# Patient Record
Sex: Male | Born: 1980 | Race: Black or African American | Hispanic: No | Marital: Single | Smoking: Current every day smoker
Health system: Southern US, Community
[De-identification: ages and names within clinical notes are randomized; demographics above are authoritative.]

## PROBLEM LIST (undated history)

## (undated) DIAGNOSIS — L309 Dermatitis, unspecified: Secondary | ICD-10-CM

## (undated) DIAGNOSIS — I10 Essential (primary) hypertension: Secondary | ICD-10-CM

---

## 2010-04-08 ENCOUNTER — Emergency Department (HOSPITAL_BASED_OUTPATIENT_CLINIC_OR_DEPARTMENT_OTHER)
Admission: EM | Admit: 2010-04-08 | Discharge: 2010-04-08 | Payer: Self-pay | Source: Home / Self Care | Admitting: Emergency Medicine

## 2012-04-13 ENCOUNTER — Emergency Department (HOSPITAL_BASED_OUTPATIENT_CLINIC_OR_DEPARTMENT_OTHER): Payer: Medicaid Other

## 2012-04-13 ENCOUNTER — Encounter (HOSPITAL_BASED_OUTPATIENT_CLINIC_OR_DEPARTMENT_OTHER): Payer: Self-pay | Admitting: *Deleted

## 2012-04-13 ENCOUNTER — Emergency Department (HOSPITAL_BASED_OUTPATIENT_CLINIC_OR_DEPARTMENT_OTHER)
Admission: EM | Admit: 2012-04-13 | Discharge: 2012-04-13 | Disposition: A | Discharge status: Home / Self Care | Payer: Medicaid Other | Attending: Emergency Medicine | Admitting: Emergency Medicine

## 2012-04-13 DIAGNOSIS — IMO0001 Reserved for inherently not codable concepts without codable children: Secondary | ICD-10-CM | POA: Insufficient documentation

## 2012-04-13 DIAGNOSIS — R0789 Other chest pain: Secondary | ICD-10-CM | POA: Insufficient documentation

## 2012-04-13 DIAGNOSIS — M5412 Radiculopathy, cervical region: Secondary | ICD-10-CM

## 2012-04-13 DIAGNOSIS — IMO0002 Reserved for concepts with insufficient information to code with codable children: Secondary | ICD-10-CM | POA: Insufficient documentation

## 2012-04-13 DIAGNOSIS — M542 Cervicalgia: Secondary | ICD-10-CM | POA: Insufficient documentation

## 2012-04-13 DIAGNOSIS — F17200 Nicotine dependence, unspecified, uncomplicated: Secondary | ICD-10-CM | POA: Insufficient documentation

## 2012-04-13 HISTORY — DX: Essential (primary) hypertension: I10

## 2012-04-13 NOTE — ED Provider Notes (Signed)
History     CSN: [id number]  Arrival date & time 04/13/12  1002   First MD Initiated Contact with Patient 04/13/12 1051      Chief Complaint  Patient presents with  . Arm Pain    (Consider location/radiation/quality/duration/timing/severity/associated sxs/prior treatment) HPI Patient complains of posterior neck pain radiating to both arms onset one week ago when he coughed forcibly while smoking a cigarette. Reports coughing for 2 months, nonproductive. Pain in right arm has resolved over the past few days. Left arm pain continues accompanied by a numb feeling in the left biceps area. He also reports left anterior chest pain with coughing for the past week no fever no shortness of breath no other complaint. No treatment prior to coming here. No other associated symptoms Past Medical History  Diagnosis Date  . Hypertension     History reviewed. No pertinent past surgical history.  No family history on file.  History  Substance Use Topics  . Smoking status: Current Every Day Smoker  . Smokeless tobacco: Not on file  . Alcohol Use: Yes   positive marijuana use    Review of Systems  Constitutional: Negative.   HENT: Positive for neck pain.   Respiratory: Positive for cough.   Cardiovascular: Positive for chest pain.  Gastrointestinal: Negative.   Musculoskeletal: Positive for myalgias.  Skin: Negative.   Neurological: Negative.   Hematological: Negative.   Psychiatric/Behavioral: Negative.   All other systems reviewed and are negative.    Allergies  Hydrocodone  Home Medications  No current outpatient prescriptions on file.  BP 162/89  Pulse 81  Temp 98 F (36.7 C) (Oral)  Resp 20  SpO2 100%  Physical Exam  Nursing note and vitals reviewed. Constitutional: He is oriented to person, place, and time. He appears well-developed and well-nourished.  HENT:  Head: Normocephalic and atraumatic.  Eyes: Conjunctivae normal are normal. Pupils are equal, round,  and reactive to light.  Neck: Neck supple. No tracheal deviation present. No thyromegaly present.       No bruit  Cardiovascular: Normal rate and regular rhythm.   No murmur heard. Pulmonary/Chest: Effort normal and breath sounds normal.  Abdominal: Soft. Bowel sounds are normal. He exhibits no distension. There is no tenderness.  Musculoskeletal: Normal range of motion. He exhibits no edema and no tenderness.       Entire spine nontender pain at left upper anterior arm on forcible flexion of the elbow  Lymphadenopathy:    He has no cervical adenopathy.  Neurological: He is alert and oriented to person, place, and time. He has normal reflexes. Coordination normal.       Motor strength 5 over 5 overall  Skin: Skin is warm and dry. No rash noted.  Psychiatric: He has a normal mood and affect.    ED Course  Procedures (including critical care time)  Labs Reviewed - No data to display No results found.  No results found for this or any previous visit. Dg Chest 2 View  04/13/2012  *RADIOLOGY REPORT*  Clinical Data: Cough for 2 months  CHEST - 2 VIEW  Comparison: Chest x-ray of 04/08/2010  Findings: No active infiltrate or effusion is seen.  Mild cardiomegaly is stable.  Mediastinal contours are stable.  No bony abnormality is seen.  IMPRESSION: Stable mild cardiomegaly.  No active lung disease.   Original Report Authenticated By: Dwyane Dee, M.D.     No diagnosis found. Chest x-ray reviewed by me Counseled patient on smoking cessation for 5  minutes  MDM  Arm pain and numbness in left arm likely secondary to cervical radiculopathy. Right arm symptoms have resolved within a few days  Plan Tylenol when necessary pain referral resource guide blood pressure recheck 3 weeks. Smoking cessation encouraged Diagnosis #1 cervical radiculopathy #2 chronic cough #3 hypertension #4 tobacco abuse      Doug Sou, MD 04/13/12 1218

## 2012-04-13 NOTE — ED Notes (Addendum)
Patient states that last weekend he was coughing and suddenly experiencing a sharp pain down his left arm that has continued to hurt. Feels burning and tingling in his neck that extends into the bicep of his left arm. Some tingling in his R arm, but not as bad. Concerned about constant coughing

## 2016-09-22 ENCOUNTER — Encounter (HOSPITAL_COMMUNITY): Payer: Self-pay | Admitting: Emergency Medicine

## 2016-09-22 ENCOUNTER — Emergency Department (HOSPITAL_COMMUNITY)
Admission: EM | Admit: 2016-09-22 | Discharge: 2016-09-22 | Disposition: A | Discharge status: Home / Self Care | Payer: Medicaid Other | Attending: Emergency Medicine | Admitting: Emergency Medicine

## 2016-09-22 DIAGNOSIS — F17200 Nicotine dependence, unspecified, uncomplicated: Secondary | ICD-10-CM | POA: Insufficient documentation

## 2016-09-22 DIAGNOSIS — L03113 Cellulitis of right upper limb: Secondary | ICD-10-CM

## 2016-09-22 DIAGNOSIS — L259 Unspecified contact dermatitis, unspecified cause: Secondary | ICD-10-CM | POA: Insufficient documentation

## 2016-09-22 DIAGNOSIS — L309 Dermatitis, unspecified: Secondary | ICD-10-CM

## 2016-09-22 DIAGNOSIS — I10 Essential (primary) hypertension: Secondary | ICD-10-CM | POA: Insufficient documentation

## 2016-09-22 HISTORY — DX: Dermatitis, unspecified: L30.9

## 2016-09-22 MED ORDER — CEPHALEXIN 500 MG PO CAPS: 500.0000 mg | ORAL_CAPSULE | Freq: Four times a day (QID) | ORAL | 0 refills | Status: AC

## 2016-09-22 MED ORDER — TRIAMCINOLONE ACETONIDE 0.1 % EX CREA: 1.0000 "application " | TOPICAL_CREAM | Freq: Two times a day (BID) | CUTANEOUS | 0 refills | Status: AC

## 2016-09-22 MED ORDER — SULFAMETHOXAZOLE-TRIMETHOPRIM 800-160 MG PO TABS: 1.0000 | ORAL_TABLET | Freq: Two times a day (BID) | ORAL | 0 refills | Status: AC

## 2016-09-22 NOTE — ED Notes (Signed)
Patient also wants to left knee to be checked due to having issues with fluid it on it for while.

## 2016-09-22 NOTE — Discharge Instructions (Signed)
Please read instructions below. Apply the triamcinolone cream to your arm 2 times per day. You can apply aquaphor or vaseline over this cream to help keep cream from rubbing off. Take the antibiotics as prescribed until they are gone.  Follow up with your primary care provider within 1 week. Return to the ER for fever, purulent drainage, or new or concerning symptoms.

## 2016-09-22 NOTE — ED Triage Notes (Signed)
Patient states since Tuesday been having swelling and pain in right elbow. Patient states that he has eczema and SO told him he has been scratching at arms a lot at night.  Patient state that feels like right arm is weaker due to swelling and pain.

## 2016-09-22 NOTE — ED Provider Notes (Signed)
[facility]-EMERGENCY DEPT Provider Note   CSN: [id number] Arrival date & time: 09/22/16  0806     History   Chief Complaint Chief Complaint  Patient presents with  . Joint Swelling    HPI Marvin Mcguire is a 36 y.o. male.  Patient with past medical history of eczema, hypertension, presents with a right arm pain and swelling since Tuesday evening. Patient states pain is worse with movement, not relieved with home remedies including shave better and charcoal soap. Patient reports charcoal soap is a been a new topical treatment times one week. He reports he has also been scratching his right arm in his sleep, per his significant other. States his arm is itchy, however more painful. Denies fever, chills, purulent drainage, history gout.       Past Medical History:  Diagnosis Date  . Eczema   . Hypertension     There are no active problems to display for this patient.   History reviewed. No pertinent surgical history.     Home Medications    Prior to Admission medications   Medication Sig Start Date End Date Taking? Authorizing Provider  acetaminophen (PAIN RELIEF) 500 MG tablet Take 500 mg by mouth every 6 (six) hours as needed (For pain.).   Yes [provider]  cephALEXin (KEFLEX) 500 MG capsule Take 1 capsule (500 mg total) by mouth 4 (four) times daily. 09/22/16 09/29/16  Russo, [place] N, PA-C  sulfamethoxazole-trimethoprim (BACTRIM DS,SEPTRA DS) 800-160 MG tablet Take 1 tablet by mouth 2 (two) times daily. 09/22/16 09/29/16  Russo, [place] N, PA-C  triamcinolone cream (KENALOG) 0.1 % Apply 1 application topically 2 (two) times daily. 09/22/16 09/29/16  Russo, [place] N, PA-C    Family History No family history on file.  Social History Social History  Substance Use Topics  . Smoking status: Current Every Day Smoker  . Smokeless tobacco: Never Used  . Alcohol use Yes     Allergies   Hydrocodone   Review of Systems Review of Systems  Constitutional: Negative for  chills and fever.  Musculoskeletal: Positive for joint swelling.  Skin: Positive for rash.     Physical Exam Updated Vital Signs BP (!) 163/115 (BP Location: Left Arm) Comment: [place], PA in room while BP was taken and deemed pt safe to be discharged.  Pulse 78   Temp 97.5 F (36.4 C) (Oral)   Resp 18   Ht 6\' 2"  (1.88 m)   Wt 122.5 kg (270 lb)   SpO2 100%   BMI 34.67 kg/m   Physical Exam  Constitutional: He appears well-developed and well-nourished. No distress.  HENT:  Head: Normocephalic and atraumatic.  Eyes: Conjunctivae are normal.  Cardiovascular: Normal rate and intact distal pulses.   Pulmonary/Chest: Effort normal.  Musculoskeletal:  Normal ROM of elbows, both internal and external rotation of the forearm, and elbow flexion and extension.  Skin:  Right arm with confluent eczematoid rash, skin is leathery, left arm w less severe rash. Area of erythema and significant edema surrounding R elbow. No obvious abscess. No purulent drainage.   Psychiatric: He has a normal mood and affect. His behavior is normal.  Nursing note and vitals reviewed.    ED Treatments / Results  Labs (all labs ordered are listed, but only abnormal results are displayed) Labs Reviewed - No data to display  EKG  EKG Interpretation None       Radiology No results found.  Procedures Procedures (including critical care time)  Medications Ordered in ED  Medications - No data to display   Initial Impression / Assessment and Plan / ED Course  I have reviewed the triage vital signs and the nursing notes.  Pertinent labs & imaging results that were available during my care of the patient were reviewed by me and considered in my medical decision making (see chart for details).     Pt presents with ezcema flare and surrounding cellulitis to right arm. Pt with normal ROM of elbow, afebrile, low suspicion for septic arthritis or gout. No hx of immunocompromise. Pt hypertensive in ED;  reports he does not take medication for his BP and uses home remedies; pt is asymptomatic.Will send with topical triamcinolone, instructed to use Aquaphor over steroid cream, drink plenty of fluids.  Pt given Rx for keflex and bactrim for cellulitis. Recommend f/u with PCP.  Pt safe for discharge home.  Patient discussed with and seen by Dr. Fredderick PhenixBelfi.  Discussed results, findings, treatment and follow up. Patient advised of strict return precautions. Patient verbalized understanding and agreed with plan.  Final Clinical Impressions(s) / ED Diagnoses   Final diagnoses:  Eczema of both upper extremities  Cellulitis of right arm    New Prescriptions Discharge Medication List as of 09/22/2016 11:08 AM    START taking these medications   Details  cephALEXin (KEFLEX) 500 MG capsule Take 1 capsule (500 mg total) by mouth 4 (four) times daily., Starting Fri 09/22/2016, Until Fri 09/29/2016, Print    sulfamethoxazole-trimethoprim (BACTRIM DS,SEPTRA DS) 800-160 MG tablet Take 1 tablet by mouth 2 (two) times daily., Starting Fri 09/22/2016, Until Fri 09/29/2016, Print    triamcinolone cream (KENALOG) 0.1 % Apply 1 application topically 2 (two) times daily., Starting Fri 09/22/2016, Until Fri 09/29/2016, Print         Russo, [place] N, PA-C 09/22/16 1441    Rolan BuccoBelfi, Melanie, MD 09/22/16 [phone]

## 2016-09-26 DIAGNOSIS — L309 Dermatitis, unspecified: Secondary | ICD-10-CM | POA: Insufficient documentation

## 2016-09-26 DIAGNOSIS — I10 Essential (primary) hypertension: Secondary | ICD-10-CM | POA: Insufficient documentation

## 2019-09-03 DIAGNOSIS — G4733 Obstructive sleep apnea (adult) (pediatric): Secondary | ICD-10-CM | POA: Insufficient documentation

## 2019-09-03 DIAGNOSIS — Z9989 Dependence on other enabling machines and devices: Secondary | ICD-10-CM | POA: Insufficient documentation

## 2020-07-05 ENCOUNTER — Emergency Department (HOSPITAL_COMMUNITY)
Admission: EM | Admit: 2020-07-05 | Discharge: 2020-07-05 | Disposition: A | Discharge status: Home / Self Care | Payer: Self-pay | Attending: Emergency Medicine | Admitting: Emergency Medicine

## 2020-07-05 ENCOUNTER — Other Ambulatory Visit: Payer: Self-pay

## 2020-07-05 ENCOUNTER — Encounter (HOSPITAL_COMMUNITY): Payer: Self-pay

## 2020-07-05 ENCOUNTER — Emergency Department (HOSPITAL_COMMUNITY): Payer: Self-pay

## 2020-07-05 DIAGNOSIS — M79672 Pain in left foot: Secondary | ICD-10-CM

## 2020-07-05 DIAGNOSIS — I10 Essential (primary) hypertension: Secondary | ICD-10-CM | POA: Insufficient documentation

## 2020-07-05 DIAGNOSIS — F17210 Nicotine dependence, cigarettes, uncomplicated: Secondary | ICD-10-CM | POA: Insufficient documentation

## 2020-07-05 DIAGNOSIS — S99912A Unspecified injury of left ankle, initial encounter: Secondary | ICD-10-CM | POA: Insufficient documentation

## 2020-07-05 DIAGNOSIS — X58XXXA Exposure to other specified factors, initial encounter: Secondary | ICD-10-CM | POA: Insufficient documentation

## 2020-07-05 MED ORDER — OXYCODONE-ACETAMINOPHEN 5-325 MG PO TABS
1.0000 | ORAL_TABLET | Freq: Once | ORAL | Status: AC
Start: 2020-07-05 — End: 2020-07-05
  Administered 2020-07-05: 1 via ORAL
  Filled 2020-07-05: qty 1

## 2020-07-05 NOTE — Discharge Instructions (Addendum)
Please follow-up with your sports medicine orthopedist regarding your joint pain.  Take Tylenol or Motrin for pain control.  Return if your pain is uncontrolled, you develop numbness, weakness, skin color changes.

## 2020-07-05 NOTE — ED Triage Notes (Signed)
Patient c/o left ankle fracture that occurred 2 months ago. Patient states 4 days ago he began having achilles pain and left inner ankle.

## 2020-07-05 NOTE — ED Provider Notes (Signed)
[facility]-EMERGENCY DEPT Provider Note   CSN: [id number] Arrival date & time: 07/05/20  1819     History Chief Complaint  Patient presents with  . Ankle Injury    Marvin Mcguire is a 39 y.o. male.  Presents to ER with concern for ankle/foot pain.  He had a fall and was diagnosed with an ankle fracture 2 months ago.  He states this occurred when he was in [place].  He was put in a boot and told to follow-up with orthopedics but he never did.  States that he stopped using the boot when his symptoms improved.  Had been symptom free for a couple weeks however over the past few days he has had worsening pain, pain is worse around his heel, and inner ankle.  Worse with movement.  He denies any numbness, weakness, skin color changes.  No new injuries.  Has not taken any medicine for this today.  HPI     Past Medical History:  Diagnosis Date  . Eczema   . Hypertension     There are no problems to display for this patient.   History reviewed. No pertinent surgical history.     No family history on file.  Social History   Tobacco Use  . Smoking status: Current Every Day Smoker    Packs/day: 1.00    Types: Cigarettes  . Smokeless tobacco: Never Used  Vaping Use  . Vaping Use: Former  . Substances: Nicotine, Flavoring  Substance Use Topics  . Alcohol use: Yes  . Drug use: No    Home Medications Prior to Admission medications   Medication Sig Start Date End Date Taking? Authorizing Provider  acetaminophen (PAIN RELIEF) 500 MG tablet Take 500 mg by mouth every 6 (six) hours as needed (For pain.).    [provider]    Allergies    Hydrocodone  Review of Systems   Review of Systems  Musculoskeletal: Positive for arthralgias.  All other systems reviewed and are negative.   Physical Exam Updated Vital Signs BP (!) 166/114 (BP Location: Left Arm)   Pulse (!) 107   Temp 97.9 F (36.6 C) (Oral)   Resp 16   Ht 6\' 2"  (1.88 m)    Wt 131.5 kg   SpO2 98%   BMI 37.23 kg/m   Physical Exam Vitals and nursing note reviewed.  Constitutional:      Appearance: He is well-developed.  HENT:     Head: Normocephalic and atraumatic.  Eyes:     Conjunctiva/sclera: Conjunctivae normal.  Cardiovascular:     Rate and Rhythm: Normal rate.     Pulses: Normal pulses.  Pulmonary:     Effort: Pulmonary effort is normal. No respiratory distress.  Abdominal:     Palpations: Abdomen is soft.     Tenderness: There is no abdominal tenderness.  Musculoskeletal:        General: No deformity or signs of injury.     Cervical back: Neck supple.     Comments: Left lower extremity: There is mild tenderness around the base of foot, medial malleolus, normal dorsiflexion plantarflexion, negative Thompson test; no tenderness throughout the thigh or calf, normal DP and PT pulses, sensation intact  Skin:    General: Skin is warm and dry.  Neurological:     Mental Status: He is alert.     ED Results / Procedures / Treatments   Labs (all labs ordered are listed, but only abnormal results are displayed) Labs  Reviewed - No data to display  EKG None  Radiology DG Foot Complete Left  Result Date: 07/05/2020 CLINICAL DATA:  History of left ankle fracture EXAM: LEFT FOOT - COMPLETE 3+ VIEW COMPARISON:  None. FINDINGS: No fracture or malalignment. Small plantar calcaneal spur. Mild degenerative changes at the first MTP joint and TMT joint. IMPRESSION: No acute osseous abnormality. Electronically Signed   By: Jasmine Pang M.D.   On: 07/05/2020 19:15    Procedures Procedures   Medications Ordered in ED Medications  oxyCODONE-acetaminophen (PERCOCET/ROXICET) 5-325 MG per tablet 1 tablet (1 tablet Oral Given 07/05/20 2217)    ED Course  I have reviewed the triage vital signs and the nursing notes.  Pertinent labs & imaging results that were available during my care of the patient were reviewed by me and considered in my medical decision  making (see chart for details).    MDM Rules/Calculators/A&P                         40 year old male presents to ER with concern for ankle and foot pain.  Reported trauma a couple months ago and prior dx of ankle fracture. His ankle appeared normal, some tenderness around heel and medial malleolus. Normal joint ROM, plantar/dorsiflexion intact, negative thompson test. Neurovascularly intact.  Suspect MSK strain. Plain film of foot was normal.  Recommend he follow-up with Ortho.  Discharged home.     After the discussed management above, the patient was determined to be safe for discharge.  The patient was in agreement with this plan and all questions regarding their care were answered.  ED return precautions were discussed and the patient will return to the ED with any significant worsening of condition.   Final Clinical Impression(s) / ED Diagnoses Final diagnoses:  Left foot pain    Rx / DC Orders ED Discharge Orders    None       Milagros Loll, MD 07/05/20 2223

## 2021-04-07 ENCOUNTER — Other Ambulatory Visit: Payer: Self-pay

## 2021-04-07 ENCOUNTER — Ambulatory Visit
Admission: EM | Admit: 2021-04-07 | Discharge: 2021-04-07 | Disposition: A | Discharge status: Home / Self Care | Payer: Self-pay | Attending: Emergency Medicine | Admitting: Emergency Medicine

## 2021-04-07 DIAGNOSIS — N451 Epididymitis: Secondary | ICD-10-CM | POA: Insufficient documentation

## 2021-04-07 DIAGNOSIS — M545 Low back pain, unspecified: Secondary | ICD-10-CM | POA: Insufficient documentation

## 2021-04-07 DIAGNOSIS — R1030 Lower abdominal pain, unspecified: Secondary | ICD-10-CM | POA: Insufficient documentation

## 2021-04-07 DIAGNOSIS — R1031 Right lower quadrant pain: Secondary | ICD-10-CM | POA: Insufficient documentation

## 2021-04-07 LAB — POCT URINALYSIS DIP (MANUAL ENTRY)
Bilirubin, UA: NEGATIVE
Blood, UA: NEGATIVE
Glucose, UA: NEGATIVE mg/dL
Ketones, POC UA: NEGATIVE mg/dL
Leukocytes, UA: NEGATIVE
Nitrite, UA: NEGATIVE
Protein Ur, POC: NEGATIVE mg/dL
Spec Grav, UA: <=1.005 — AB (ref 1.010–1.025)
Urobilinogen, UA: 0.2 E.U./dL
pH, UA: 5.5 (ref 5.0–8.0)

## 2021-04-07 MED ORDER — CEFTRIAXONE SODIUM 500 MG IJ SOLR
500.0000 mg | Freq: Once | INTRAMUSCULAR | Status: AC
  Administered 2021-04-07: 500 mg via INTRAMUSCULAR

## 2021-04-07 MED ORDER — LEVOFLOXACIN 500 MG PO TABS: 500.0000 mg | ORAL_TABLET | Freq: Every day | ORAL | 0 refills | Status: AC

## 2021-04-07 NOTE — ED Triage Notes (Addendum)
Pt reports having lower back pain on the right side (he states he believes it is a UTI). The patient states he has been having groin pain and frequent urination.  Started: 2 wks ago

## 2021-04-07 NOTE — Discharge Instructions (Addendum)
Current guidelines recommend ceftriaxone and levofloxacin for treatment of presumed epididymitis.  Based on your very helpful description of right groin pain up under your right testicle, this is most certainly epididymitis.  Ceftriaxone was provided by injection during her visit today.  Levofloxacin should be taken once daily for the next 10 days.  The urinalysis we performed the office today was not remarkable.  Urine culture will be performed and typically takes 3 to 5 days.  If there are any abnormal findings you will be contacted by phone, otherwise the result will be posted to your MyChart.    If you have not had complete relief of your symptoms in the next 7 to 10 days, please return for repeat evaluation.  I also strongly recommend you follow-up with your primary care provider for evaluation of benign prostatic hypertrophy, a common finding in men over the age of 70 that can cause increased frequency of urination.

## 2021-04-07 NOTE — ED Provider Notes (Signed)
[facility]    CSN: [id number] Arrival date & time: 04/07/21  1459    HISTORY  No chief complaint on file.  HPI Marvin Mcguire is a 40 y.o. male. Pt reports having right-sided lower back pain on the right side, localized pain in his right testicle that feels that it is up underneath his testicle, states his testicle itself does not hurt, states he has not noticed any changes in size of his testicle.  Patient also states he has noticed that he has increased frequency of urination.  Patient requests UTI testing.  Patient states his wife performed two home UTI tests, states one of the tests came back positive for bacteria, the other 1 did not.  Patient denies fever, aches, chills, genital lesions, penile discharge, burning with urination.  Patient denies known exposure to sexually transmitted disease, states he is in a monogamous relationship with his wife.  Patient states he does a lot of heavy lifting at his job.  Patient states he also took a 4-hour road trip about 3 weeks ago, states that was when his back started hurting.  Patient states that his testicle pain and increased frequency of urination began about 2 weeks ago.  The history is provided by the patient.  Past Medical History:  Diagnosis Date   Eczema    Hypertension    There are no problems to display for this patient.  History reviewed. No pertinent surgical history.  Home Medications    Prior to Admission medications   Medication Sig Start Date End Date Taking? Authorizing Provider  acetaminophen (PAIN RELIEF) 500 MG tablet Take 500 mg by mouth every 6 (six) hours as needed (For pain.).    [provider]   Family History History reviewed. No pertinent family history. Social History Social History   Tobacco Use   Smoking status: Every Day    Packs/day: 1.00    Types: Cigarettes   Smokeless tobacco: Never  Vaping Use   Vaping Use: Former   Substances: Nicotine, Flavoring  Substance Use  Topics   Alcohol use: Yes   Drug use: No   Allergies   Hydrocodone  Review of Systems Review of Systems Pertinent findings noted in history of present illness.   Physical Exam Triage Vital Signs ED Triage Vitals  Enc Vitals Group     BP 02/18/21 0827 (!) 147/82     Pulse Rate 02/18/21 0827 72     Resp 02/18/21 0827 18     Temp 02/18/21 0827 98.3 F (36.8 C)     Temp Source 02/18/21 0827 Oral     SpO2 02/18/21 0827 98 %     Weight --      Height --      Head Circumference --      Peak Flow --      Pain Score 02/18/21 0826 5     Pain Loc --      Pain Edu? --      Excl. in GC? --   No data found.  Updated Vital Signs BP (!) 189/133 (BP Location: Right Arm) Comment: pt states he does get headaches at times, provider aware   Pulse 68    Temp 98.7 F (37.1 C) (Oral)    SpO2 97%   Physical Exam Vitals and nursing note reviewed.  Constitutional:      General: He is not in acute distress.    Appearance: Normal appearance. He is not ill-appearing.  HENT:  Head: Normocephalic and atraumatic.  Eyes:     General: Lids are normal.        Right eye: No discharge.        Left eye: No discharge.     Extraocular Movements: Extraocular movements intact.     Conjunctiva/sclera: Conjunctivae normal.     Right eye: Right conjunctiva is not injected.     Left eye: Left conjunctiva is not injected.  Neck:     Trachea: Trachea and phonation normal.  Cardiovascular:     Rate and Rhythm: Normal rate and regular rhythm.     Pulses: Normal pulses.     Heart sounds: Normal heart sounds. No murmur heard.   No friction rub. No gallop.  Pulmonary:     Effort: Pulmonary effort is normal. No accessory muscle usage, prolonged expiration or respiratory distress.     Breath sounds: Normal breath sounds. No stridor, decreased air movement or transmitted upper airway sounds. No decreased breath sounds, wheezing, rhonchi or rales.  Chest:     Chest wall: No tenderness.  Genitourinary:     Comments: Pt politely declines GU exam, pt did provide a penile swab and a urine sample for testing.   Musculoskeletal:        General: Normal range of motion.     Cervical back: Normal range of motion and neck supple. Normal range of motion.  Lymphadenopathy:     Cervical: No cervical adenopathy.  Skin:    General: Skin is warm and dry.     Findings: No erythema or rash.  Neurological:     General: No focal deficit present.     Mental Status: He is alert and oriented to person, place, and time.  Psychiatric:        Mood and Affect: Mood normal.        Behavior: Behavior normal.    Visual Acuity Right Eye Distance:   Left Eye Distance:   Bilateral Distance:    Right Eye Near:   Left Eye Near:    Bilateral Near:     UC Couse / Diagnostics / Procedures:    EKG  Radiology No results found.  Procedures Procedures (including critical care time)  UC Diagnoses / Final Clinical Impressions(s)   I have reviewed the triage vital signs and the nursing notes.  Pertinent labs & imaging results that were available during my care of the patient were reviewed by me and considered in my medical decision making (see chart for details).   Final diagnoses:  Low back pain, unspecified back pain laterality, unspecified chronicity, unspecified whether sciatica present  Inguinal pain, unspecified laterality  Groin pain, right  Epididymitis, right   Urine dip today is unremarkable.  Given specific location of groin pain up under his right testicle, I am going to treat this patient empirically for presumed epididymitis.  Urine culture will be performed.  Antibiotics will be adjusted if needed based on urine culture findings.  Patient advised to follow-up with primary care for DRE to evaluate for possible BPH.  ED Prescriptions     Medication Sig Dispense Auth. Provider   levofloxacin (LEVAQUIN) 500 MG tablet Take 1 tablet (500 mg total) by mouth daily for 10 days. 10 tablet Theadora Rama  Scales, PA-C      PDMP not reviewed this encounter.  Pending results:  Labs Reviewed  POCT URINALYSIS DIP (MANUAL ENTRY) - Abnormal; Notable for the following components:      Result Value   Spec Grav, UA <=  1.005 (*)    All other components within normal limits  URINE CULTURE  CYTOLOGY, (ORAL, ANAL, URETHRAL) ANCILLARY ONLY    Medications Ordered in UC: Medications  cefTRIAXone (ROCEPHIN) injection 500 mg (has no administration in time range)    Disposition Upon Discharge:  Condition: stable for discharge home  Patient presents today with concerns for exposure to sexually transmitted disease, requesting testing.  STD screening was performed as indicated.  Patient has been advised that the results of screening will be made available to them via MyChart and, if there are any positive findings, they will be contacted by phone, recommendations for treatment will be advised and prescriptions will be provided as indicated based on clinical guidelines.  Patient has also been advised that if treatment is recommended, they should abstain from sexual intercourse of all forms until treatment is complete.  Patient has further been advised that once treatment is complete, they have not had a complete resolution of their symptoms, if any, they should continue to abstain from sexual intercourse with all forms and follow-up with her primary care provider or return to urgent care for repeat testing.  As such, the patient has been evaluated and assessed, work-up was performed and treatment was provided in alignment with urgent care protocols and evidence based medicine.  Patient/parent/caregiver has been advised that the patient may require follow up for further testing and/or treatment if the symptoms continue in spite of treatment, as clinically indicated and appropriate.  Routine symptom specific, illness specific and/or disease specific instructions were discussed with the patient and/or caregiver at  length.  Prevention strategies for avoiding STD exposure were also discussed.  The patient will follow up with their current PCP if and as advised. If the patient does not currently have a PCP we will assist them in obtaining one.   The patient may need specialty follow up if the symptoms continue, in spite of conservative treatment and management, for further workup, evaluation, consultation and treatment as clinically indicated and appropriate.  Patient/parent/caregiver verbalized understanding and agreement of plan as discussed.  All questions were addressed during visit.  Please see discharge instructions below for further details of plan.  Discharge Instructions:   Discharge Instructions      Current guidelines recommend ceftriaxone and levofloxacin for treatment of presumed epididymitis.  Based on your very helpful description of right groin pain up under your right testicle, this is most certainly epididymitis.  Ceftriaxone was provided by injection during her visit today.  Levofloxacin should be taken once daily for the next 10 days.  The urinalysis we performed the office today was not remarkable.  Urine culture will be performed and typically takes 3 to 5 days.  If there are any abnormal findings you will be contacted by phone, otherwise the result will be posted to your MyChart.    If you have not had complete relief of your symptoms in the next 7 to 10 days, please return for repeat evaluation.  I also strongly recommend you follow-up with your primary care provider for evaluation of benign prostatic hypertrophy, a common finding in men over the age of 21 that can cause increased frequency of urination.      This office note has been dictated using Teaching laboratory technician.  Unfortunately, and despite my best efforts, this method of dictation can sometimes lead to occasional typographical or grammatical errors.  I apologize in advance if this occurs.      Theadora Rama Scales, PA-C 04/07/21 1600

## 2021-04-10 LAB — URINE CULTURE: Culture: NO GROWTH

## 2021-04-11 LAB — CYTOLOGY, (ORAL, ANAL, URETHRAL) ANCILLARY ONLY
Chlamydia: NEGATIVE
Comment: NEGATIVE
Comment: NEGATIVE
Comment: NORMAL
Neisseria Gonorrhea: NEGATIVE
Trichomonas: NEGATIVE

## 2021-04-14 ENCOUNTER — Other Ambulatory Visit: Payer: Self-pay

## 2021-04-14 ENCOUNTER — Emergency Department (HOSPITAL_COMMUNITY): Payer: Self-pay

## 2021-04-14 ENCOUNTER — Emergency Department (HOSPITAL_COMMUNITY)
Admission: EM | Admit: 2021-04-14 | Discharge: 2021-04-14 | Disposition: A | Discharge status: Home / Self Care | Payer: Self-pay | Attending: Emergency Medicine | Admitting: Emergency Medicine

## 2021-04-14 ENCOUNTER — Encounter (HOSPITAL_COMMUNITY): Payer: Self-pay | Admitting: Emergency Medicine

## 2021-04-14 DIAGNOSIS — R109 Unspecified abdominal pain: Secondary | ICD-10-CM | POA: Insufficient documentation

## 2021-04-14 DIAGNOSIS — F17210 Nicotine dependence, cigarettes, uncomplicated: Secondary | ICD-10-CM | POA: Insufficient documentation

## 2021-04-14 DIAGNOSIS — M545 Low back pain, unspecified: Secondary | ICD-10-CM | POA: Insufficient documentation

## 2021-04-14 DIAGNOSIS — I10 Essential (primary) hypertension: Secondary | ICD-10-CM | POA: Insufficient documentation

## 2021-04-14 DIAGNOSIS — N50811 Right testicular pain: Secondary | ICD-10-CM | POA: Insufficient documentation

## 2021-04-14 LAB — CBC WITH DIFFERENTIAL/PLATELET
Abs Immature Granulocytes: 0.03 10*3/uL (ref 0.00–0.07)
Basophils Absolute: 0 10*3/uL (ref 0.0–0.1)
Basophils Relative: 0 %
Eosinophils Absolute: 0.3 10*3/uL (ref 0.0–0.5)
Eosinophils Relative: 3 %
HCT: 43 % (ref 39.0–52.0)
Hemoglobin: 14.3 g/dL (ref 13.0–17.0)
Immature Granulocytes: 0 %
Lymphocytes Relative: 32 %
Lymphs Abs: 3.5 10*3/uL (ref 0.7–4.0)
MCH: 31.3 pg (ref 26.0–34.0)
MCHC: 33.3 g/dL (ref 30.0–36.0)
MCV: 94.1 fL (ref 80.0–100.0)
Monocytes Absolute: 0.9 10*3/uL (ref 0.1–1.0)
Monocytes Relative: 8 %
Neutro Abs: 6.2 10*3/uL (ref 1.7–7.7)
Neutrophils Relative %: 57 %
Platelets: 330 10*3/uL (ref 150–400)
RBC: 4.57 MIL/uL (ref 4.22–5.81)
RDW: 15.4 % (ref 11.5–15.5)
WBC: 10.9 10*3/uL — ABNORMAL HIGH (ref 4.0–10.5)
nRBC: 0 % (ref 0.0–0.2)

## 2021-04-14 LAB — COMPREHENSIVE METABOLIC PANEL
ALT: 29 U/L (ref 0–44)
AST: 20 U/L (ref 15–41)
Albumin: 4.8 g/dL (ref 3.5–5.0)
Alkaline Phosphatase: 49 U/L (ref 38–126)
Anion gap: 6 (ref 5–15)
BUN: 15 mg/dL (ref 6–20)
CO2: 28 mmol/L (ref 22–32)
Calcium: 9.4 mg/dL (ref 8.9–10.3)
Chloride: 105 mmol/L (ref 98–111)
Creatinine, Ser: 0.98 mg/dL (ref 0.61–1.24)
GFR, Estimated: >60 mL/min (ref >60)
Glucose, Bld: 79 mg/dL (ref 70–99)
Potassium: 4.2 mmol/L (ref 3.5–5.1)
Sodium: 139 mmol/L (ref 135–145)
Total Bilirubin: 0.6 mg/dL (ref 0.3–1.2)
Total Protein: 8.2 g/dL — ABNORMAL HIGH (ref 6.5–8.1)

## 2021-04-14 LAB — URINALYSIS, ROUTINE W REFLEX MICROSCOPIC
Bilirubin Urine: NEGATIVE
Glucose, UA: NEGATIVE mg/dL
Hgb urine dipstick: NEGATIVE
Ketones, ur: NEGATIVE mg/dL
Leukocytes,Ua: NEGATIVE
Nitrite: NEGATIVE
Protein, ur: NEGATIVE mg/dL
Specific Gravity, Urine: 1.015 (ref 1.005–1.030)
pH: 6 (ref 5.0–8.0)

## 2021-04-14 LAB — LIPASE, BLOOD: Lipase: 29 U/L (ref 11–51)

## 2021-04-14 MED ORDER — AMLODIPINE BESYLATE 5 MG PO TABS: 5.0000 mg | ORAL_TABLET | Freq: Every day | ORAL | 1 refills | Status: DC

## 2021-04-14 MED ORDER — AMLODIPINE BESYLATE 5 MG PO TABS
5.0000 mg | ORAL_TABLET | Freq: Once | ORAL | Status: AC
Start: 2021-04-14 — End: 2021-04-14
  Administered 2021-04-14: 20:00:00 5 mg via ORAL
  Filled 2021-04-14: qty 1

## 2021-04-14 NOTE — ED Triage Notes (Signed)
Patient states he was seen at UC last week and diagnosed with epididymitis. States he has been taking his antibiotics but his back pain has continued and now having lower abdominal pain. Patient adds his fiance has been monitoring his BP and it has been elevated over the past few weeks. Pt adds he had been tested for sleep apnea in the past but was not able to afford the machine and is wanting to try and get one now.

## 2021-04-14 NOTE — Discharge Instructions (Addendum)
Make an appointment to follow-up with urology for further evaluation of the right testicular pain.  Start taking the amlodipine as directed daily for your high blood pressure.  Make an appointment to follow-up with the wellness clinic to have this followed up.  This medication may not be adequate to get your blood pressure under control.  Rest of today's work-up without any significant findings which is reassuring.  Return for any new or worse symptoms.

## 2021-04-14 NOTE — ED Provider Notes (Signed)
[facility] DEPT Provider Note   CSN: [id number] Arrival date & time: 04/14/21  1553     History Chief Complaint  Patient presents with   Abdominal Pain   Back Pain    Marvin Mcguire is a 40 y.o. male.  Patient presenting with a complaint right lower back pain.  Sore at the iliac crest area that radiates to his right testicle.  Patient seen December 15 in urgent care and treated as if epididymitis.  Patient on doxycycline.  Almost ready to finish the antibiotic.  But symptoms have not improved.  Patient is also been told in the past that he has hypertension.  Patient also told that he has sleep apnea.  Patient about 2 weeks ago quit drinking as well.  Patient denies any dysuria.  Denies any blood in his urine.  No nausea no vomiting.  Patient not tachycardic.      Past Medical History:  Diagnosis Date   Eczema    Hypertension     There are no problems to display for this patient.   History reviewed. No pertinent surgical history.     History reviewed. No pertinent family history.  Social History   Tobacco Use   Smoking status: Every Day    Packs/day: 1.00    Types: Cigarettes   Smokeless tobacco: Never  Vaping Use   Vaping Use: Former   Substances: Nicotine, Flavoring  Substance Use Topics   Alcohol use: Not Currently    Comment: quit 04/02/2021   Drug use: No    Home Medications Prior to Admission medications   Medication Sig Start Date End Date Taking? Authorizing Provider  acetaminophen (PAIN RELIEF) 500 MG tablet Take 500 mg by mouth every 6 (six) hours as needed (For pain.).    [provider]  levofloxacin (LEVAQUIN) 500 MG tablet Take 1 tablet (500 mg total) by mouth daily for 10 days. 04/07/21 04/17/21  Lynden Oxford Scales, PA-C    Allergies    Hydrocodone  Review of Systems   Review of Systems  Constitutional:  Negative for chills and fever.  HENT:  Negative for ear pain and sore throat.   Eyes:   Negative for pain and visual disturbance.  Respiratory:  Negative for cough and shortness of breath.   Cardiovascular:  Negative for chest pain and palpitations.  Gastrointestinal:  Negative for abdominal pain and vomiting.  Genitourinary:  Positive for testicular pain. Negative for dysuria, hematuria, penile discharge and scrotal swelling.  Musculoskeletal:  Positive for back pain. Negative for arthralgias.  Skin:  Negative for color change and rash.  Neurological:  Negative for seizures and syncope.  All other systems reviewed and are negative.  Physical Exam Updated Vital Signs BP (!) 193/133 (BP Location: Right Arm)    Pulse 65    Temp 98.5 F (36.9 C) (Oral)    Resp 14    Ht 1.88 m (6\' 2" )    Wt 124.7 kg    SpO2 100%    BMI 35.31 kg/m   Physical Exam Vitals and nursing note reviewed.  Constitutional:      General: He is not in acute distress.    Appearance: Normal appearance. He is well-developed.  HENT:     Head: Normocephalic and atraumatic.  Eyes:     Extraocular Movements: Extraocular movements intact.     Conjunctiva/sclera: Conjunctivae normal.     Pupils: Pupils are equal, round, and reactive to light.  Cardiovascular:     Rate and  Rhythm: Normal rate and regular rhythm.     Heart sounds: No murmur heard. Pulmonary:     Effort: Pulmonary effort is normal. No respiratory distress.     Breath sounds: Normal breath sounds.  Abdominal:     Palpations: Abdomen is soft.     Tenderness: There is no abdominal tenderness.  Genitourinary:    Penis: Normal.      Comments: Some discomfort to the rear of the right testicle Musculoskeletal:        General: No swelling.     Cervical back: Normal range of motion and neck supple.  Skin:    General: Skin is warm and dry.     Capillary Refill: Capillary refill takes less than 2 seconds.  Neurological:     General: No focal deficit present.     Mental Status: He is alert and oriented to person, place, and time.     Cranial  Nerves: No cranial nerve deficit.     Sensory: No sensory deficit.     Motor: No weakness.  Psychiatric:        Mood and Affect: Mood normal.    ED Results / Procedures / Treatments   Labs (all labs ordered are listed, but only abnormal results are displayed) Labs Reviewed  COMPREHENSIVE METABOLIC PANEL - Abnormal; Notable for the following components:      Result Value   Total Protein 8.2 (*)    All other components within normal limits  CBC WITH DIFFERENTIAL/PLATELET - Abnormal; Notable for the following components:   WBC 10.9 (*)    All other components within normal limits  LIPASE, BLOOD  URINALYSIS, ROUTINE W REFLEX MICROSCOPIC    EKG None  Radiology CT Renal Stone Study  Result Date: 04/14/2021 CLINICAL DATA:  Flank pain, kidney stone suspected. Diagnosed with epididymitis last week. On antibiotics with persistent low abdominal pain. Laterality not specified. EXAM: CT ABDOMEN AND PELVIS WITHOUT CONTRAST TECHNIQUE: Multidetector CT imaging of the abdomen and pelvis was performed following the standard protocol without IV contrast. COMPARISON:  No prior CT available.  Scrotal ultrasound same date. FINDINGS: Lower chest: No acute findings. There is a thin walled air cyst in the left lower lobe with mild mosaic attenuation of the lung bases. No significant pleural or pericardial effusion. Hepatobiliary: The liver appears unremarkable as imaged in the noncontrast state. No evidence of gallstones, gallbladder wall thickening or biliary dilatation. Pancreas: Unremarkable. No pancreatic ductal dilatation or surrounding inflammatory changes. Spleen: Normal in size without focal abnormality. Adrenals/Urinary Tract: Both adrenal glands appear normal. Both kidneys appear unremarkable. No evidence of urinary tract calculus, hydronephrosis or perinephric soft tissue stranding. The bladder appears normal. Stomach/Bowel: No enteric contrast administered. The stomach appears unremarkable for its  degree of distension. No evidence of bowel wall thickening, distention or surrounding inflammatory change. The appendix appears normal. Prominent stool throughout the colon with scattered diverticulosis. Vascular/Lymphatic: There are no enlarged abdominal or pelvic lymph nodes. No significant vascular findings on noncontrast imaging. Reproductive: The prostate gland and seminal vesicles appear unremarkable. Other: Tiny umbilical hernia containing only fat. Otherwise intact abdominal wall. No ascites or free intraperitoneal air. Musculoskeletal: No acute or significant osseous findings. IMPRESSION: 1. No acute findings or clear explanation for flank pain. No evidence of urinary tract calculus or hydronephrosis. 2. Prominent stool throughout the colon with scattered mild diverticulosis. 3. Mild mosaic attenuation at the lung bases which may relate to small airways disease. Electronically Signed   By: Caryl Comes.D.  On: 04/14/2021 19:16   US SCROTUM W/DOPPLER  Result Date: 04/14/2021 CLINICAL DATA:  Lower abdominal pain, epididymitis EXAM: SCROTAL ULTRASOUND DOPPLER ULTRASOUND OF THE TESTICLES TECHNIQUE: Complete ultrasound examination of the testicles, epididymis, and other scrotal structures was performed. Color and spectral Doppler ultrasound were also utilized to evaluate blood flow to the testicles. COMPARISON:  None. FINDINGS: Right testicle Measurements: 4.4 x 2.8 x 3.7 cm. No mass or microlithiasis visualized. Left testicle Measurements: 4.3 x 2.5 x 3.0 cm. No mass or microlithiasis visualized. Right epididymis:  Normal in size and appearance. Left epididymis:  Normal in size and appearance. Hydrocele:  None visualized. Varicocele:  None visualized. Pulsed Doppler interrogation of both testes demonstrates normal low resistance arterial and venous waveforms bilaterally. IMPRESSION: Negative scrotal ultrasound. No evidence of testicular torsion. Electronically Signed   By: Charline Bills M.D.    On: 04/14/2021 18:12    Procedures Procedures   Medications Ordered in ED Medications  amLODipine (NORVASC) tablet 5 mg (5 mg Oral Given 04/14/21 1942)    ED Course  I have reviewed the triage vital signs and the nursing notes.  Pertinent labs & imaging results that were available during my care of the patient were reviewed by me and considered in my medical decision making (see chart for details).    MDM Rules/Calculators/A&P                          Ultrasound done of scrotum with Doppler.  No acute findings.  Negative scrotal ultrasound no evidence of testicular torsion.  CT scan of the abdomen done because of questionable flank pain.  Was really low in the lumbar back on the right side.  CT scan without any acute findings other than a little bit of constipation.  Urinalysis here is negative.  Mild leukocytosis with a white count of 10.9.  Otherwise normal.  Lipase normal liver function tests normal electrolytes normal.  Patient's blood pressures though are quite high with diastolics in above 120.  And systolics in the 190 range.  But patient without any acute symptoms related to that.  No evidence of any significant endorgan dysfunction.  Patient's renal function is normal.  I feel patient needs follow-up with urology for the right testicular pain.  We do not have a good explanation for that.  No evidence of epididymitis here today.  But he will finish out his antibiotics.  Patient clearly needs treatment for the hypertension.  Started him on amlodipine here and will continue that at home we will have him follow-up with the wellness clinic for blood pressure checks.  Patient understands that this medication may not be sufficient to completely control his blood pressure.   Final Clinical Impression(s) / ED Diagnoses Final diagnoses:  Primary hypertension  Testicular pain, right  Flank pain    Rx / DC Orders ED Discharge Orders     None        Vanetta Mulders, MD 04/14/21  2019

## 2021-04-23 ENCOUNTER — Emergency Department (HOSPITAL_BASED_OUTPATIENT_CLINIC_OR_DEPARTMENT_OTHER)
Admission: EM | Admit: 2021-04-23 | Discharge: 2021-04-23 | Disposition: A | Discharge status: Home / Self Care | Payer: Self-pay | Attending: Emergency Medicine | Admitting: Emergency Medicine

## 2021-04-23 ENCOUNTER — Other Ambulatory Visit: Payer: Self-pay

## 2021-04-23 ENCOUNTER — Emergency Department (HOSPITAL_BASED_OUTPATIENT_CLINIC_OR_DEPARTMENT_OTHER): Payer: Self-pay

## 2021-04-23 ENCOUNTER — Encounter (HOSPITAL_BASED_OUTPATIENT_CLINIC_OR_DEPARTMENT_OTHER): Payer: Self-pay | Admitting: Emergency Medicine

## 2021-04-23 DIAGNOSIS — I10 Essential (primary) hypertension: Secondary | ICD-10-CM | POA: Insufficient documentation

## 2021-04-23 DIAGNOSIS — F17210 Nicotine dependence, cigarettes, uncomplicated: Secondary | ICD-10-CM | POA: Insufficient documentation

## 2021-04-23 DIAGNOSIS — R1010 Upper abdominal pain, unspecified: Secondary | ICD-10-CM

## 2021-04-23 DIAGNOSIS — R1012 Left upper quadrant pain: Secondary | ICD-10-CM | POA: Insufficient documentation

## 2021-04-23 DIAGNOSIS — R1013 Epigastric pain: Secondary | ICD-10-CM | POA: Insufficient documentation

## 2021-04-23 DIAGNOSIS — Z79899 Other long term (current) drug therapy: Secondary | ICD-10-CM | POA: Insufficient documentation

## 2021-04-23 LAB — COMPREHENSIVE METABOLIC PANEL
ALT: 27 U/L (ref 0–44)
AST: 22 U/L (ref 15–41)
Albumin: 4.3 g/dL (ref 3.5–5.0)
Alkaline Phosphatase: 52 U/L (ref 38–126)
Anion gap: 8 (ref 5–15)
BUN: 12 mg/dL (ref 6–20)
CO2: 25 mmol/L (ref 22–32)
Calcium: 9 mg/dL (ref 8.9–10.3)
Chloride: 105 mmol/L (ref 98–111)
Creatinine, Ser: 0.86 mg/dL (ref 0.61–1.24)
GFR, Estimated: >60 mL/min (ref >60)
Glucose, Bld: 109 mg/dL — ABNORMAL HIGH (ref 70–99)
Potassium: 4.2 mmol/L (ref 3.5–5.1)
Sodium: 138 mmol/L (ref 135–145)
Total Bilirubin: 0.5 mg/dL (ref 0.3–1.2)
Total Protein: 8.1 g/dL (ref 6.5–8.1)

## 2021-04-23 LAB — URINALYSIS, ROUTINE W REFLEX MICROSCOPIC
Bilirubin Urine: NEGATIVE
Glucose, UA: NEGATIVE mg/dL
Hgb urine dipstick: NEGATIVE
Ketones, ur: NEGATIVE mg/dL
Leukocytes,Ua: NEGATIVE
Nitrite: NEGATIVE
Protein, ur: NEGATIVE mg/dL
Specific Gravity, Urine: 1.02 (ref 1.005–1.030)
pH: 7 (ref 5.0–8.0)

## 2021-04-23 LAB — CBC
HCT: 42.5 % (ref 39.0–52.0)
Hemoglobin: 14.4 g/dL (ref 13.0–17.0)
MCH: 30.6 pg (ref 26.0–34.0)
MCHC: 33.9 g/dL (ref 30.0–36.0)
MCV: 90.4 fL (ref 80.0–100.0)
Platelets: 369 10*3/uL (ref 150–400)
RBC: 4.7 MIL/uL (ref 4.22–5.81)
RDW: 14.9 % (ref 11.5–15.5)
WBC: 10.4 10*3/uL (ref 4.0–10.5)
nRBC: 0 % (ref 0.0–0.2)

## 2021-04-23 LAB — LIPASE, BLOOD: Lipase: 34 U/L (ref 11–51)

## 2021-04-23 MED ORDER — ONDANSETRON 4 MG PO TBDP: 4.0000 mg | ORAL_TABLET | Freq: Once | ORAL | Status: DC | PRN

## 2021-04-23 MED ORDER — PANTOPRAZOLE SODIUM 20 MG PO TBEC: 20.0000 mg | DELAYED_RELEASE_TABLET | Freq: Every day | ORAL | 1 refills | Status: AC

## 2021-04-23 MED ORDER — HYDRALAZINE HCL 20 MG/ML IJ SOLN
10.0000 mg | Freq: Once | INTRAMUSCULAR | Status: AC
  Administered 2021-04-23: 10 mg via INTRAVENOUS
  Filled 2021-04-23: qty 1

## 2021-04-23 MED ORDER — AMLODIPINE BESYLATE 10 MG PO TABS: 10.0000 mg | ORAL_TABLET | Freq: Every day | ORAL | 1 refills | Status: DC

## 2021-04-23 MED ORDER — IOHEXOL 300 MG/ML  SOLN
100.0000 mL | Freq: Once | INTRAMUSCULAR | Status: AC | PRN
  Administered 2021-04-23: 100 mL via INTRAVENOUS

## 2021-04-23 MED ORDER — PANTOPRAZOLE SODIUM 40 MG IV SOLR
40.0000 mg | Freq: Once | INTRAVENOUS | Status: AC
  Administered 2021-04-23: 40 mg via INTRAVENOUS
  Filled 2021-04-23: qty 40

## 2021-04-23 NOTE — ED Notes (Signed)
D/c paperwork reviewed with pt, including prescriptions and f/u care. All questions addressed prior to d/c. Pt ambulatory to ED exit with s/o without assistance.

## 2021-04-23 NOTE — ED Triage Notes (Signed)
Pt arrives pov with c/o HTN and LUQ pain w nausea x 1 week. Pt endorses just starting b/p meds.

## 2021-04-23 NOTE — Discharge Instructions (Signed)
You were seen in the emergency department for elevated blood pressure and upper abdominal pain.  You had lab work done urinalysis and a CAT scan that did not show an obvious explanation for your symptoms.  Your blood pressure was elevated so we are increasing your amlodipine to 10 mg a day.  We are also starting you on some acid medication.  Please keep your appointment with primary care.  Return to the emergency department if any worsening or concerning symptoms

## 2021-04-23 NOTE — ED Notes (Signed)
Pt ambulatory to bathroom, no assistance needed.  

## 2021-04-23 NOTE — ED Provider Notes (Signed)
[facility] EMERGENCY DEPARTMENT Provider Note   CSN: [id number] Arrival date & time: 04/23/21  2585     History Chief Complaint  Patient presents with   Abdominal Pain    Marvin Mcguire is a 40 y.o. male.  He is here with a complaint of 3 weeks of abdominal pain and elevated blood pressure.  He said it is started after he discontinued drinking alcohol.  The initial pain was more in his scrotal area and lower abdomen.  Went to urgent care where they gave him antibiotics for possible epididymitis.  Was in the ED about 10 days ago and had a CAT scan and scrotal ultrasound along with labs.  He was started on amlodipine.  He still notes his blood pressure is high and he feels foggy in his head sometimes.  No chest pain or shortness of breath.  Scrotal symptoms have improved.  Still has upper abdominal pain now.  The history is provided by the patient.  Abdominal Pain Pain location:  Epigastric and LUQ Pain quality: aching   Pain radiates to:  Does not radiate Pain severity:  Moderate Onset quality:  Gradual Duration:  3 weeks Timing:  Intermittent Progression:  Unchanged Chronicity:  New Context: not recent travel and not trauma   Relieved by:  None tried Worsened by:  Nothing Ineffective treatments:  None tried Associated symptoms: no chest pain, no chills, no constipation, no cough, no diarrhea, no dysuria, no fever, no hematemesis, no hematochezia, no hematuria, no nausea, no shortness of breath, no sore throat and no vomiting       Past Medical History:  Diagnosis Date   Eczema    Hypertension     There are no problems to display for this patient.   History reviewed. No pertinent surgical history.     History reviewed. No pertinent family history.  Social History   Tobacco Use   Smoking status: Every Day    Packs/day: 1.00    Types: Cigarettes   Smokeless tobacco: Never  Vaping Use   Vaping Use: Former   Substances: Nicotine, Flavoring   Substance Use Topics   Alcohol use: Not Currently    Comment: quit 04/02/2021   Drug use: Yes    Types: Marijuana    Home Medications Prior to Admission medications   Medication Sig Start Date End Date Taking? Authorizing Provider  acetaminophen (PAIN RELIEF) 500 MG tablet Take 500 mg by mouth every 6 (six) hours as needed (For pain.).    [provider]  amLODipine (NORVASC) 5 MG tablet Take 1 tablet (5 mg total) by mouth daily. 04/14/21   Vanetta Mulders, MD    Allergies    Hydrocodone  Review of Systems   Review of Systems  Constitutional:  Negative for chills and fever.  HENT:  Negative for sore throat.   Eyes:  Negative for visual disturbance.  Respiratory:  Negative for cough and shortness of breath.   Cardiovascular:  Negative for chest pain.  Gastrointestinal:  Positive for abdominal pain. Negative for constipation, diarrhea, hematemesis, hematochezia, nausea and vomiting.  Genitourinary:  Negative for dysuria and hematuria.  Musculoskeletal:  Negative for back pain.  Skin:  Negative for rash.  Neurological:  Positive for headaches.   Physical Exam Updated Vital Signs BP (!) 168/118    Pulse 69    Temp 98.1 F (36.7 C) (Oral)    Resp 18    Ht 6\' 2"  (1.88 m)    Wt 129.3 kg  SpO2 100%    BMI 36.59 kg/m   Physical Exam Vitals and nursing note reviewed.  Constitutional:      General: He is not in acute distress.    Appearance: Normal appearance. He is well-developed.  HENT:     Head: Normocephalic and atraumatic.  Eyes:     Conjunctiva/sclera: Conjunctivae normal.  Cardiovascular:     Rate and Rhythm: Normal rate and regular rhythm.     Heart sounds: No murmur heard. Pulmonary:     Effort: Pulmonary effort is normal. No respiratory distress.     Breath sounds: Normal breath sounds.  Abdominal:     Palpations: Abdomen is soft.     Tenderness: There is no abdominal tenderness. There is no guarding or rebound.  Musculoskeletal:        General: No  swelling. Normal range of motion.     Cervical back: Neck supple.     Right lower leg: No edema.     Left lower leg: No edema.  Skin:    General: Skin is warm and dry.     Capillary Refill: Capillary refill takes less than 2 seconds.  Neurological:     General: No focal deficit present.     Mental Status: He is alert.  Psychiatric:        Mood and Affect: Mood normal.    ED Results / Procedures / Treatments   Labs (all labs ordered are listed, but only abnormal results are displayed) Labs Reviewed  COMPREHENSIVE METABOLIC PANEL - Abnormal; Notable for the following components:      Result Value   Glucose, Bld 109 (*)    All other components within normal limits  URINALYSIS, ROUTINE W REFLEX MICROSCOPIC - Abnormal; Notable for the following components:   Color, Urine STRAW (*)    All other components within normal limits  LIPASE, BLOOD  CBC    EKG EKG Interpretation  Date/Time:  Saturday April 23 2021 09:17:52 EST Ventricular Rate:  77 PR Interval:  152 QRS Duration: 80 QT Interval:  394 QTC Calculation: 445 R Axis:   -13 Text Interpretation: Normal sinus rhythm Possible Anterior infarct , age undetermined Abnormal ECG No previous ECGs available Confirmed by Aletta Edouard [phone]) on 04/23/2021 10:17:38 AM  Radiology CT Abdomen Pelvis W Contrast  Result Date: 04/23/2021 CLINICAL DATA:  Left upper quadrant abdominal pain and nausea for 1 week. EXAM: CT ABDOMEN AND PELVIS WITH CONTRAST TECHNIQUE: Multidetector CT imaging of the abdomen and pelvis was performed using the standard protocol following bolus administration of intravenous contrast. CONTRAST:  173mL OMNIPAQUE IOHEXOL 300 MG/ML  SOLN COMPARISON:  04/14/2021 FINDINGS: Lower chest: The lung bases are clear of acute process. No pleural effusion or pulmonary lesions. The heart is normal in size. No pericardial effusion. The distal esophagus and aorta are unremarkable. Hepatobiliary: No hepatic lesions or  intrahepatic biliary dilatation. The gallbladder is normal. No common bile duct dilatation. Pancreas: No mass, inflammation or ductal dilatation. Spleen: Normal size.  No focal lesions. Adrenals/Urinary Tract: Adrenal glands and kidneys are normal. The bladder is unremarkable. Stomach/Bowel: The stomach, duodenum, small bowel and colon are unremarkable. No acute inflammatory changes, mass lesions or obstructive findings. The terminal ileum is normal. The appendix is normal. Vascular/Lymphatic: The aorta is normal in caliber. No dissection. The branch vessels are patent. The major venous structures are patent. No mesenteric or retroperitoneal mass or adenopathy. Small scattered lymph nodes are noted. Reproductive: The prostate gland and seminal vesicles are unremarkable. Other: No pelvic  mass or adenopathy. No free pelvic fluid collections. No inguinal mass or adenopathy. No abdominal wall hernia or subcutaneous lesions. Musculoskeletal: No significant bony findings. IMPRESSION: No acute abdominal/pelvic findings, mass lesions or adenopathy. Electronically Signed   By: Marijo Sanes M.D.   On: 04/23/2021 14:01    Procedures Procedures   Medications Ordered in ED Medications  ondansetron (ZOFRAN-ODT) disintegrating tablet 4 mg (has no administration in time range)  pantoprazole (PROTONIX) injection 40 mg (has no administration in time range)  hydrALAZINE (APRESOLINE) injection 10 mg (has no administration in time range)    ED Course  I have reviewed the triage vital signs and the nursing notes.  Pertinent labs & imaging results that were available during my care of the patient were reviewed by me and considered in my medical decision making (see chart for details).  Clinical Course as of 04/23/21 1733  Sat Apr 23, 2021  1422 On review of patient's prior blood pressures he seems to be consistently elevated.  We will increase his amlodipine that he was restarted on.  Also started on a PPI. [MB]  [id number]  Reviewed results of work-up with him.  He is comfortable plan for discharge.  He has a PCP appointment in about 10 days.  Return instructions discussed [MB]    Clinical Course User Index [MB] Hayden Rasmussen, MD   MDM Rules/Calculators/A&P                         This patient complains of elevated blood pressures, upper abdominal pain; this involves an extensive number of treatment Options and is a complaint that carries with it a high risk of complications and Morbidity. The differential includes hypertension, hypertensive emergency, dissection, peptic ulcer disease, gastritis,, splenic infarct  I ordered, reviewed and interpreted labs, which included CBC with normal white count normal hemoglobin, chemistries normal, lipase normal, urinalysis unremarked I ordered medication IV Protonix, IV hydralazine for elevated blood pressure I ordered imaging studies which included CT abdomen pelvis with contrast and I independently    visualized and interpreted imaging which showed no acute findings Additional history obtained from patient's wife Previous records obtained and reviewed in epic including prior ED visit 10 days ago  After the interventions stated above, I reevaluated the patient and found patient to be somewhat improved.  Blood pressure remains elevated.  On review of prior vital signs patient's blood pressures have been consistently with diastolics above [id number].  He is otherwise not in distress.  We will increase his amlodipine and add a PPI.  He has a follow-up appointment with primary care next week.  Return instructions discussed     Final Clinical Impression(s) / ED Diagnoses Final diagnoses:  Upper abdominal pain  Primary hypertension    Rx / DC Orders ED Discharge Orders     None        Hayden Rasmussen, MD 04/23/21 1735

## 2021-05-04 NOTE — Progress Notes (Incomplete)
New Patient Office Visit  Subjective:  Patient ID: ED MANDICH, male    DOB: Dec 02, 1980  Age: 41 y.o. MRN: [id number]  CC: No chief complaint on file.   HPI JERMARCUS MCFADYEN presents for HTN Abd pain, epidydimitis  Pcv hiv hcv flu  Past Medical History:  Diagnosis Date   Eczema    Hypertension     No past surgical history on file.  No family history on file.  Social History   Socioeconomic History   Marital status: Single    Spouse name: Not on file   Number of children: Not on file   Years of education: Not on file   Highest education level: Not on file  Occupational History   Not on file  Tobacco Use   Smoking status: Every Day    Packs/day: 1.00    Types: Cigarettes   Smokeless tobacco: Never  Vaping Use   Vaping Use: Former   Substances: Nicotine, Flavoring  Substance and Sexual Activity   Alcohol use: Not Currently    Comment: quit 04/02/2021   Drug use: Yes    Types: Marijuana   Sexual activity: Not on file  Other Topics Concern   Not on file  Social History Narrative   Not on file   Social Determinants of Health   Financial Resource Strain: Not on file  Food Insecurity: Not on file  Transportation Needs: Not on file  Physical Activity: Not on file  Stress: Not on file  Social Connections: Not on file  Intimate Partner Violence: Not on file    ROS Review of Systems  Objective:   Today's Vitals: There were no vitals taken for this visit.  Physical Exam Radiology CT Abdomen Pelvis W Contrast   Result Date: 04/23/2021 CLINICAL DATA:  Left upper quadrant abdominal pain and nausea for 1 week. EXAM: CT ABDOMEN AND PELVIS WITH CONTRAST TECHNIQUE: Multidetector CT imaging of the abdomen and pelvis was performed using the standard protocol following bolus administration of intravenous contrast. CONTRAST:  OMNIPAQUE IOHEXOL 300 MG/ML  SOLN COMPARISON:  04/14/2021 FINDINGS: Lower chest: The lung bases are clear of acute  process. No pleural effusion or pulmonary lesions. The heart is normal in size. No pericardial effusion. The distal esophagus and aorta are unremarkable. Hepatobiliary: No hepatic lesions or intrahepatic biliary dilatation. The gallbladder is normal. No common bile duct dilatation. Pancreas: No mass, inflammation or ductal dilatation. Spleen: Normal size.  No focal lesions. Adrenals/Urinary Tract: Adrenal glands and kidneys are normal. The bladder is unremarkable. Stomach/Bowel: The stomach, duodenum, small bowel and colon are unremarkable. No acute inflammatory changes, mass lesions or obstructive findings. The terminal ileum is normal. The appendix is normal. Vascular/Lymphatic: The aorta is normal in caliber. No dissection. The branch vessels are patent. The major venous structures are patent. No mesenteric or retroperitoneal mass or adenopathy. Small scattered lymph nodes are noted. Reproductive: The prostate gland and seminal vesicles are unremarkable. Other: No pelvic mass or adenopathy. No free pelvic fluid collections. No inguinal mass or adenopathy. No abdominal wall hernia or subcutaneous lesions. Musculoskeletal: No significant bony findings. IMPRESSION: No acute abdominal/pelvic findings, mass lesions or adenopathy. Electronically Signed   By: Rudie Meyer M.D.   On: 04/23/2021 14:01   Assessment & Plan:   Problem List Items Addressed This Visit   None   Outpatient Encounter Medications as of 05/05/2021  Medication Sig   hydrochlorothiazide (HYDRODIURIL) 25 MG tablet Take by mouth.   acetaminophen (PAIN RELIEF) 500 MG  tablet Take 500 mg by mouth every 6 (six) hours as needed (For pain.).   amLODipine (NORVASC) 10 MG tablet Take 1 tablet (10 mg total) by mouth daily.   Multiple Vitamins-Minerals (MULTIVITAMIN MEN) TABS Take 1 tablet by mouth daily.   pantoprazole (PROTONIX) 20 MG tablet Take 1 tablet (20 mg total) by mouth daily.   triamcinolone cream (KENALOG) 0.1 % Apply topically.    No facility-administered encounter medications on file as of 05/05/2021.    Follow-up: No follow-ups on file.   Shan Levans, MD

## 2021-05-05 ENCOUNTER — Inpatient Hospital Stay: Payer: Self-pay | Admitting: Critical Care Medicine

## 2021-06-17 ENCOUNTER — Inpatient Hospital Stay: Payer: Self-pay | Admitting: Nurse Practitioner

## 2022-10-27 ENCOUNTER — Other Ambulatory Visit: Payer: Self-pay

## 2022-10-27 ENCOUNTER — Emergency Department (HOSPITAL_BASED_OUTPATIENT_CLINIC_OR_DEPARTMENT_OTHER)
Admission: EM | Admit: 2022-10-27 | Discharge: 2022-10-27 | Payer: Self-pay | Attending: Emergency Medicine | Admitting: Emergency Medicine

## 2022-10-27 ENCOUNTER — Emergency Department (HOSPITAL_BASED_OUTPATIENT_CLINIC_OR_DEPARTMENT_OTHER): Payer: Self-pay

## 2022-10-27 ENCOUNTER — Encounter (HOSPITAL_BASED_OUTPATIENT_CLINIC_OR_DEPARTMENT_OTHER): Payer: Self-pay

## 2022-10-27 DIAGNOSIS — Z79899 Other long term (current) drug therapy: Secondary | ICD-10-CM | POA: Insufficient documentation

## 2022-10-27 DIAGNOSIS — F17200 Nicotine dependence, unspecified, uncomplicated: Secondary | ICD-10-CM | POA: Insufficient documentation

## 2022-10-27 DIAGNOSIS — R0789 Other chest pain: Secondary | ICD-10-CM

## 2022-10-27 DIAGNOSIS — I10 Essential (primary) hypertension: Secondary | ICD-10-CM | POA: Insufficient documentation

## 2022-10-27 LAB — CBC
HCT: 38.3 % — ABNORMAL LOW (ref 39.0–52.0)
Hemoglobin: 13.2 g/dL (ref 13.0–17.0)
MCH: 32.1 pg (ref 26.0–34.0)
MCHC: 34.5 g/dL (ref 30.0–36.0)
MCV: 93.2 fL (ref 80.0–100.0)
Platelets: 316 10*3/uL (ref 150–400)
RBC: 4.11 MIL/uL — ABNORMAL LOW (ref 4.22–5.81)
RDW: 15.2 % (ref 11.5–15.5)
WBC: 11.5 10*3/uL — ABNORMAL HIGH (ref 4.0–10.5)
nRBC: 0 % (ref 0.0–0.2)

## 2022-10-27 LAB — TROPONIN I (HIGH SENSITIVITY)
Troponin I (High Sensitivity): 9 ng/L (ref <18)
Troponin I (High Sensitivity): 10 ng/L (ref <18)

## 2022-10-27 LAB — BASIC METABOLIC PANEL
Anion gap: 9 (ref 5–15)
BUN: 17 mg/dL (ref 6–20)
CO2: 23 mmol/L (ref 22–32)
Calcium: 8.8 mg/dL — ABNORMAL LOW (ref 8.9–10.3)
Chloride: 103 mmol/L (ref 98–111)
Creatinine, Ser: 1.13 mg/dL (ref 0.61–1.24)
GFR, Estimated: >60 mL/min (ref >60)
Glucose, Bld: 106 mg/dL — ABNORMAL HIGH (ref 70–99)
Potassium: 3.9 mmol/L (ref 3.5–5.1)
Sodium: 135 mmol/L (ref 135–145)

## 2022-10-27 LAB — D-DIMER, QUANTITATIVE: D-Dimer, Quant: 0.4 ug/mL-FEU (ref 0.00–0.50)

## 2022-10-27 MED ORDER — AMLODIPINE BESYLATE 5 MG PO TABS
10.0000 mg | ORAL_TABLET | Freq: Once | ORAL | Status: AC
  Administered 2022-10-27: 10 mg via ORAL
  Filled 2022-10-27: qty 2

## 2022-10-27 MED ORDER — AMLODIPINE BESYLATE 10 MG PO TABS: 10.0000 mg | ORAL_TABLET | Freq: Every day | ORAL | 1 refills | Status: DC

## 2022-10-27 NOTE — Discharge Instructions (Addendum)
Take the Norvasc also known as amlodipine as directed 10 mg every day.  Make an appoint follow-up with cardiology and appointment with the wellness clinic.  Workup for the chest pain here today without any acute findings but since you are over age 42 follow-up with cardiology is important.  Return for any new or worse symptoms.  Today's evaluation showed no acute cardiac findings.  And no evidence of any blood clots in the lungs.  Also would recommend starting a baby aspirin a day.

## 2022-10-27 NOTE — ED Notes (Signed)
Lab notified to add on D-Dimer. 

## 2022-10-27 NOTE — ED Notes (Signed)
Entered pt room to assess and pt was sitting on chair and had removed vs monitoring. Pt prefers to leave it off despite education and having HTN. NAD NARD.

## 2022-10-27 NOTE — ED Provider Notes (Signed)
[place] EMERGENCY DEPARTMENT AT [facility] Provider Note   CSN: [id number] Arrival date & time: 10/27/22  1714     History  Chief Complaint  Patient presents with   Chest Pain    Marvin Mcguire is a 42 y.o. male.  Patient with a history of hyper but has been noncompliant on his meds.  But has occasionally been taken Norvasc amlodipine.  Past medical history significant for just hypertension and eczema.  Is an everyday smoker.  Patient states that the chest pain is associate with shortness of breath no leg swelling.  Chest pain has been constant but waxes and wanes.  It is worse with certain positions.       Home Medications Prior to Admission medications   Medication Sig Start Date End Date Taking? Authorizing Provider  acetaminophen (PAIN RELIEF) 500 MG tablet Take 500 mg by mouth every 6 (six) hours as needed (For pain.).    [provider]  amLODipine (NORVASC) 10 MG tablet Take 1 tablet (10 mg total) by mouth daily. 04/23/21   Terrilee Files, MD  hydrochlorothiazide (HYDRODIURIL) 25 MG tablet Take by mouth. 10/13/16   [provider]  Multiple Vitamins-Minerals (MULTIVITAMIN MEN) TABS Take 1 tablet by mouth daily.    [provider]  pantoprazole (PROTONIX) 20 MG tablet Take 1 tablet (20 mg total) by mouth daily. 04/23/21   Terrilee Files, MD  triamcinolone cream (KENALOG) 0.1 % Apply topically.    [provider]      Allergies    Hydrocodone    Review of Systems   Review of Systems  Constitutional:  Negative for chills and fever.  HENT:  Negative for ear pain and sore throat.   Eyes:  Negative for pain and visual disturbance.  Respiratory:  Positive for shortness of breath. Negative for cough.   Cardiovascular:  Positive for chest pain. Negative for palpitations and leg swelling.  Gastrointestinal:  Negative for abdominal pain and vomiting.  Genitourinary:  Negative for dysuria and hematuria.   Musculoskeletal:  Negative for arthralgias and back pain.  Skin:  Negative for color change and rash.  Neurological:  Negative for seizures and syncope.  All other systems reviewed and are negative.   Physical Exam Updated Vital Signs BP (!) 172/117 (BP Location: Right Arm)   Pulse 88   Temp 98.4 F (36.9 C) (Oral)   Resp 17   Ht 1.88 m (6\' 2" )   Wt 129.3 kg   SpO2 99%   BMI 36.59 kg/m  Physical Exam Vitals and nursing note reviewed.  Constitutional:      General: He is not in acute distress.    Appearance: Normal appearance. He is well-developed.  HENT:     Head: Normocephalic and atraumatic.  Eyes:     Extraocular Movements: Extraocular movements intact.     Conjunctiva/sclera: Conjunctivae normal.     Pupils: Pupils are equal, round, and reactive to light.  Cardiovascular:     Rate and Rhythm: Normal rate and regular rhythm.     Heart sounds: No murmur heard. Pulmonary:     Effort: Pulmonary effort is normal. No respiratory distress.     Breath sounds: Normal breath sounds.  Chest:     Chest wall: No tenderness.  Abdominal:     Palpations: Abdomen is soft.     Tenderness: There is no abdominal tenderness.  Musculoskeletal:        General: No swelling.     Cervical back:  Normal range of motion and neck supple.     Right lower leg: No edema.     Left lower leg: No edema.  Skin:    General: Skin is warm and dry.     Capillary Refill: Capillary refill takes less than 2 seconds.  Neurological:     General: No focal deficit present.     Mental Status: He is alert and oriented to person, place, and time.  Psychiatric:        Mood and Affect: Mood normal.     ED Results / Procedures / Treatments   Labs (all labs ordered are listed, but only abnormal results are displayed) Labs Reviewed  BASIC METABOLIC PANEL  CBC  TROPONIN I (HIGH SENSITIVITY)    EKG None  Radiology DG Chest 2 View  Result Date: 10/27/2022 CLINICAL DATA:  Chest pain for 2 days.   Shortness of breath. EXAM: CHEST - 2 VIEW COMPARISON:  04/13/2012 FINDINGS: Heart is upper normal in size.The cardiomediastinal contours are normal. The lungs are clear. Pulmonary vasculature is normal. No consolidation, pleural effusion, or pneumothorax. No acute osseous abnormalities are seen. IMPRESSION: Borderline cardiomegaly. No acute chest findings. Electronically Signed   By: Narda Rutherford M.D.   On: 10/27/2022 17:48    Procedures Procedures    Medications Ordered in ED Medications - No data to display  ED Course/ Medical Decision Making/ A&P                             Medical Decision Making Amount and/or Complexity of Data Reviewed Labs: ordered. Radiology: ordered.   Patient with chest pain for 2 days.  Will need cardiac enzymes.  Chest x-ray is just borderline cardiomegaly and no acute chest findings.  Patient's blood pressure is elevated here.  Will probably need to get restarted back on the amlodipine.  Patient's symptoms could be consistent perhaps with a pulmonary embolus heart rates up some at 98 oxygen sats are good at 99%.  Will see what labs and troponin show.  EKG without any acute findings.   Final Clinical Impression(s) / ED Diagnoses Final diagnoses:  Precordial pain  Primary hypertension    Rx / DC Orders ED Discharge Orders     None         Vanetta Mulders, MD 10/27/22 [id number]

## 2022-10-27 NOTE — ED Triage Notes (Addendum)
Pt arrives with c/o chest pain that started 2 days ago. Pt endorses SOB. Pt denies n/v. Per pt, pain is worse with movement. Pt has hx of HTN, but not taking BP meds at this time.

## 2023-03-10 IMAGING — CT CT RENAL STONE PROTOCOL
2 of 4 series · 16 of 46 positions shown, 18 images · non-contrast
Comparison: No prior CT available.  Scrotal ultrasound same date.

CLINICAL DATA: Flank pain, kidney stone suspected. Diagnosed with
epididymitis last week. On antibiotics with persistent low abdominal
pain. Laterality not specified.

EXAM:
CT ABDOMEN AND PELVIS WITHOUT CONTRAST
TECHNIQUE: Multidetector CT imaging of the abdomen and pelvis was performed
following the standard protocol without IV contrast.

[Series 2: axial st · axial · 0.82mm/px · z∈[-275,+225]mm · 13 of 114 slices shown, 15 images]
[im 7/114  soft-tissue]
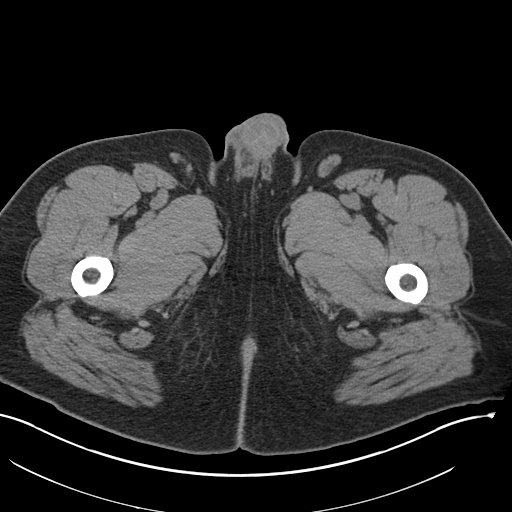
[im 7/114  bone]
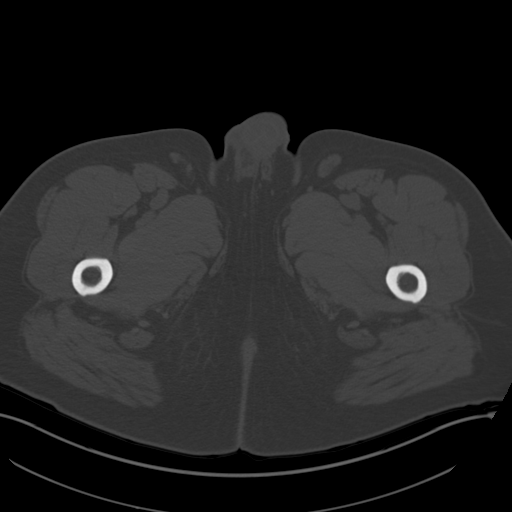
[im 14/114  soft-tissue]
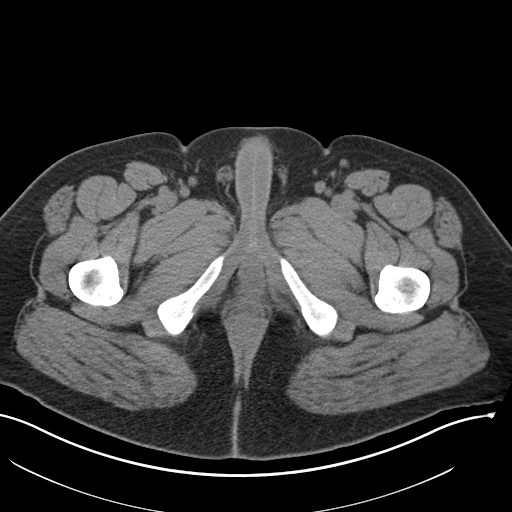
[im 27/114  soft-tissue]
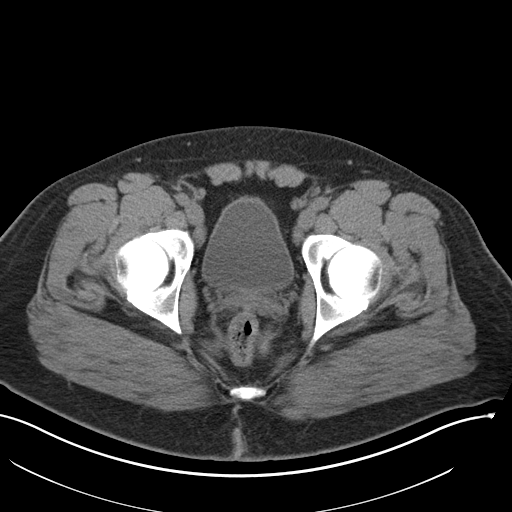
[im 34/114  soft-tissue]
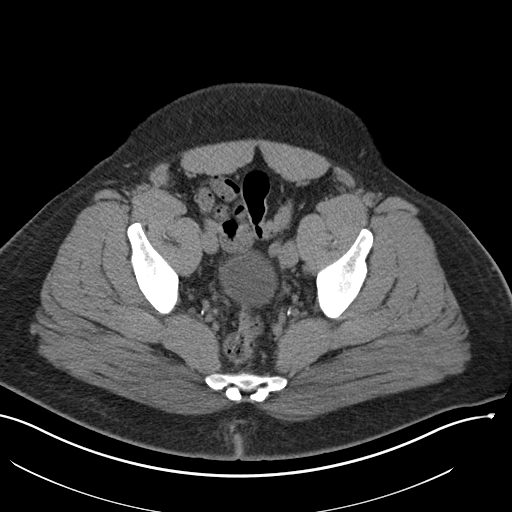
[im 40/114  soft-tissue]
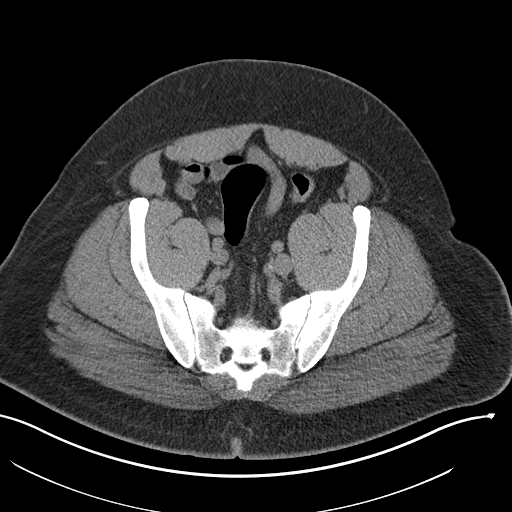
[im 47/114  soft-tissue]
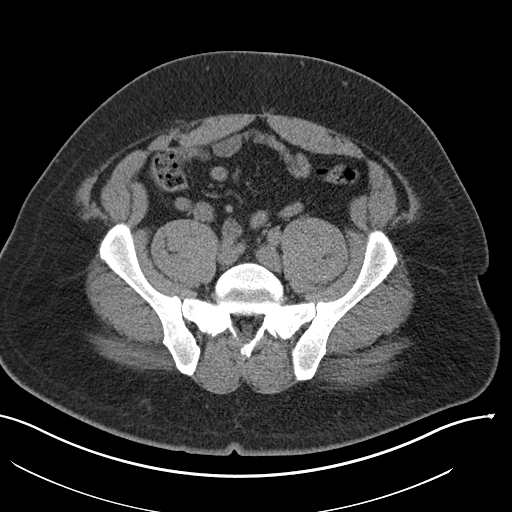
[im 60/114  soft-tissue]
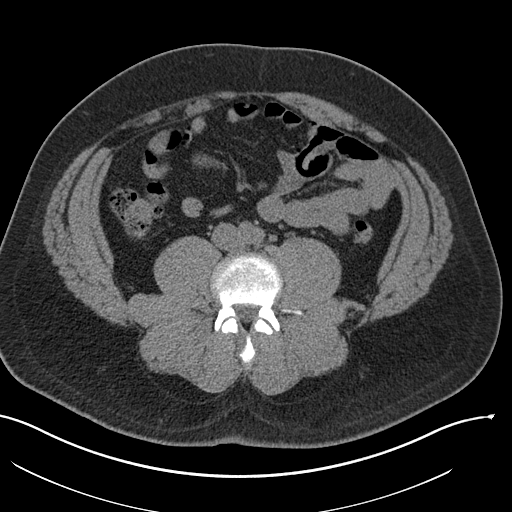
[im 67/114  soft-tissue]
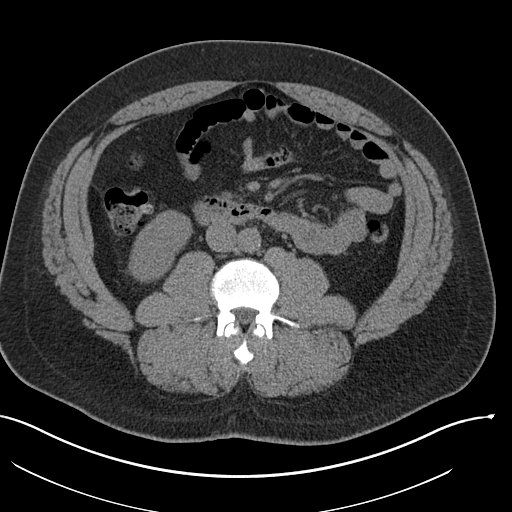
[im 74/114  soft-tissue]
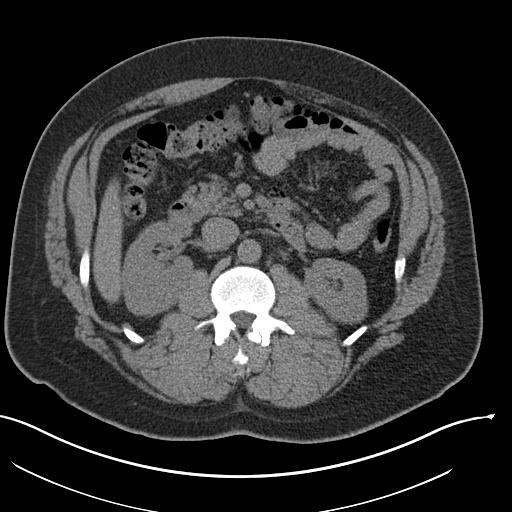
[im 74/114  bone]
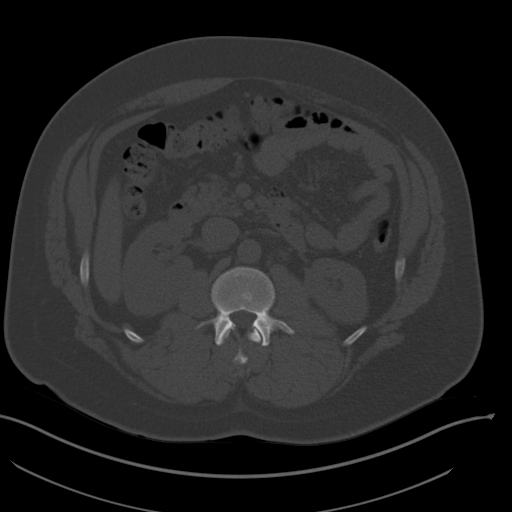
[im 80/114  soft-tissue]
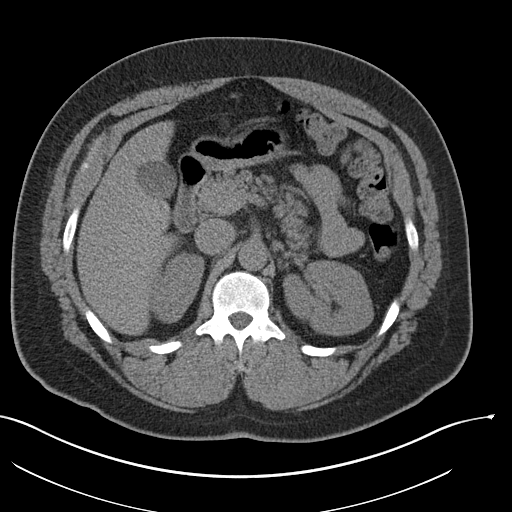
[im 87/114  soft-tissue]
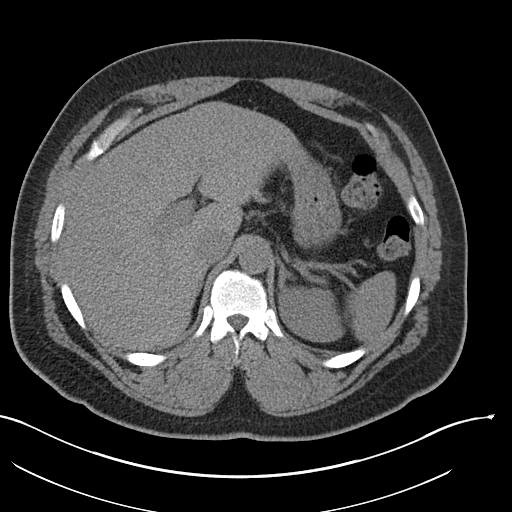
[im 100/114  soft-tissue]
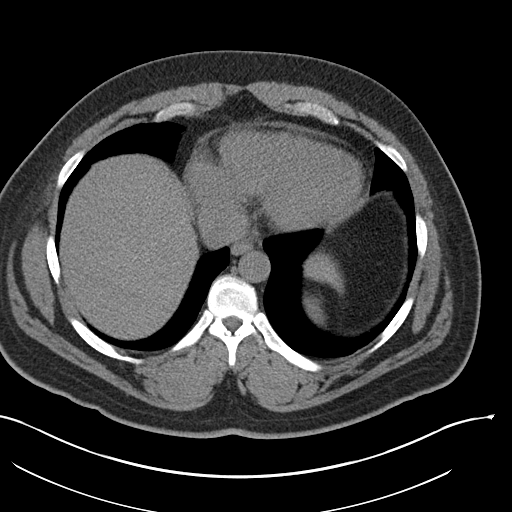
[im 107/114  soft-tissue]
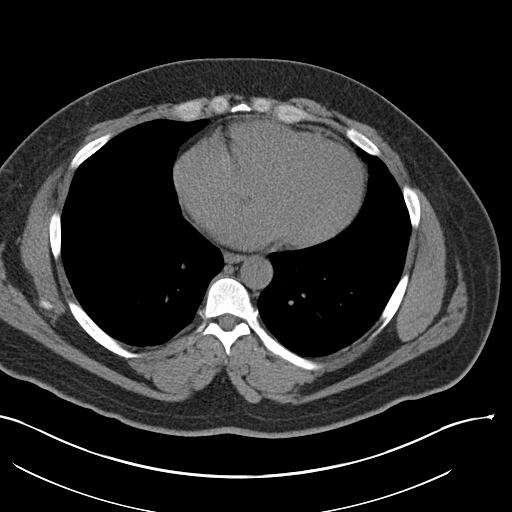

[Series 4: coronal · coronal · 0.96mm/px · 3 of 168 slices shown]
[im 56/168  soft-tissue]
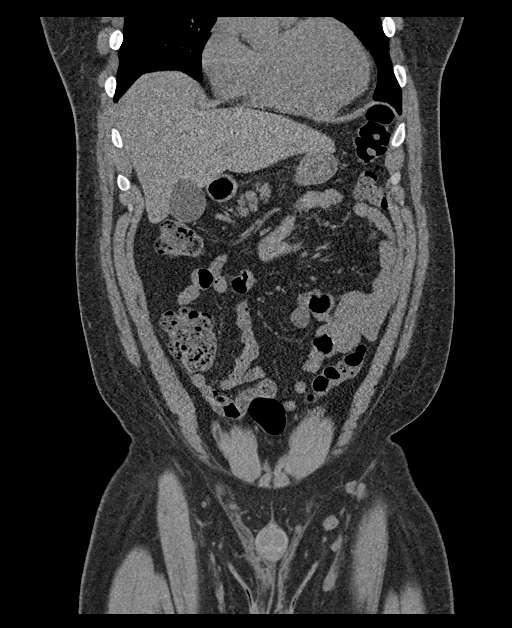
[im 75/168  soft-tissue]
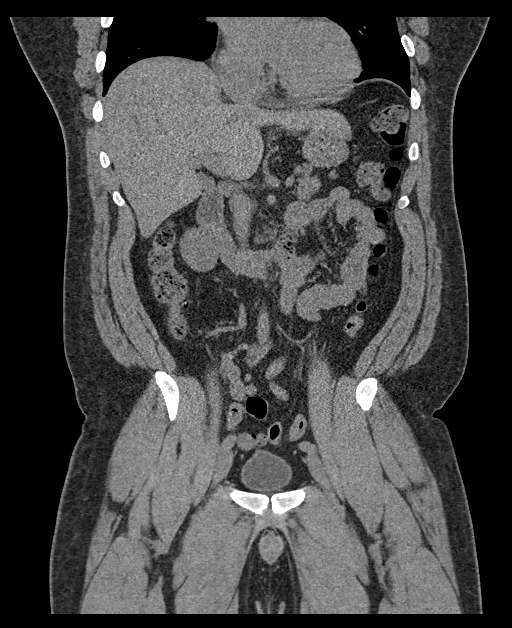
[im 93/168  soft-tissue]
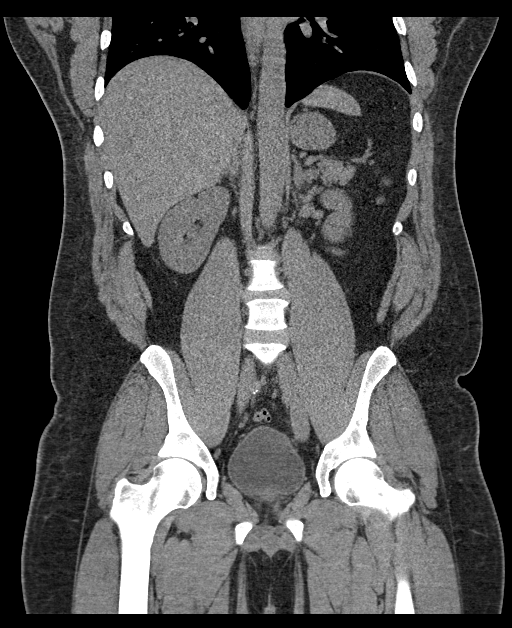

[16 of 46 positions shown; findings below may reference images not displayed]

FINDINGS: Lower chest: No acute findings. There is a thin walled air cyst in
the left lower lobe with mild mosaic attenuation of the lung bases.
No significant pleural or pericardial effusion.

Hepatobiliary: The liver appears unremarkable as imaged in the
noncontrast state. No evidence of gallstones, gallbladder wall
thickening or biliary dilatation.

Pancreas: Unremarkable. No pancreatic ductal dilatation or
surrounding inflammatory changes.

Spleen: Normal in size without focal abnormality.

Adrenals/Urinary Tract: Both adrenal glands appear normal. Both
kidneys appear unremarkable. No evidence of urinary tract calculus,
hydronephrosis or perinephric soft tissue stranding. The bladder
appears normal.

Stomach/Bowel: No enteric contrast administered. The stomach appears
unremarkable for its degree of distension. No evidence of bowel wall
thickening, distention or surrounding inflammatory change. The
appendix appears normal. Prominent stool throughout the colon with
scattered diverticulosis.

Vascular/Lymphatic: There are no enlarged abdominal or pelvic lymph
nodes. No significant vascular findings on noncontrast imaging.

Reproductive: The prostate gland and seminal vesicles appear
unremarkable.

Other: Tiny umbilical hernia containing only fat. Otherwise intact
abdominal wall. No ascites or free intraperitoneal air.

Musculoskeletal: No acute or significant osseous findings.
IMPRESSION: 1. No acute findings or clear explanation for flank pain. No
evidence of urinary tract calculus or hydronephrosis.
2. Prominent stool throughout the colon with scattered mild
diverticulosis.
3. Mild mosaic attenuation at the lung bases which may relate to
small airways disease.

## 2023-03-19 IMAGING — CT CT ABD-PELV W/ CM
2 of 5 series · 17 of 46 positions shown, 19 images · IV contrast (Omnipaque)
Comparison: 04/14/2021

CLINICAL DATA: Left upper quadrant abdominal pain and nausea for 1
week.

EXAM:
CT ABDOMEN AND PELVIS WITH CONTRAST
TECHNIQUE: Multidetector CT imaging of the abdomen and pelvis was performed
using the standard protocol following bolus administration of
intravenous contrast.
CONTRAST:  100mL OMNIPAQUE IOHEXOL 300 MG/ML  SOLN

[Series 2: axial st · axial · 0.82mm/px · z∈[-416,+44]mm · 14 of 102 slices shown, 16 images]
[im 5/102  soft-tissue]
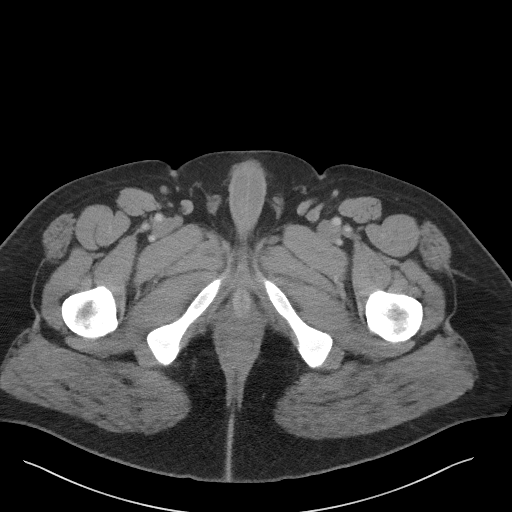
[im 5/102  bone]
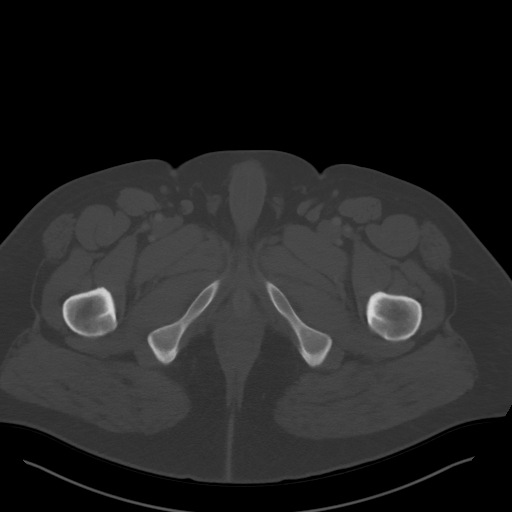
[im 15/102  soft-tissue]
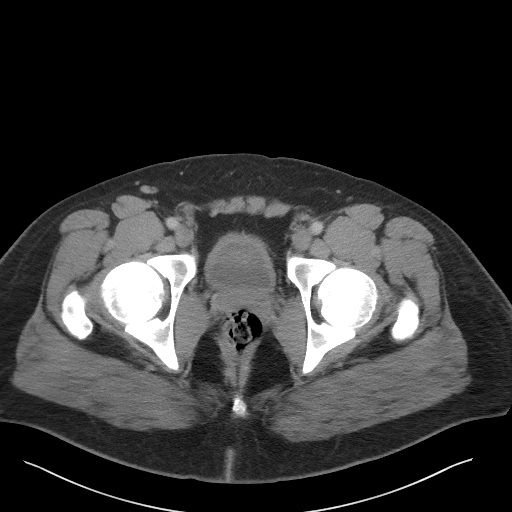
[im 20/102  soft-tissue]
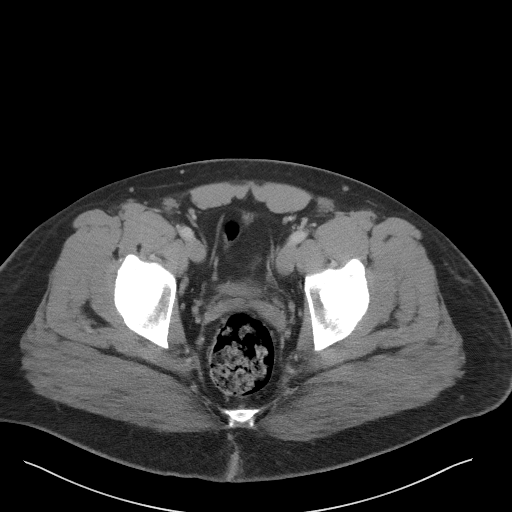
[im 29/102  soft-tissue]
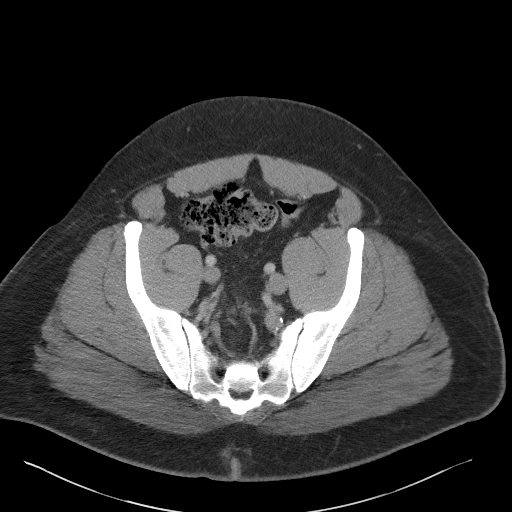
[im 34/102  soft-tissue]
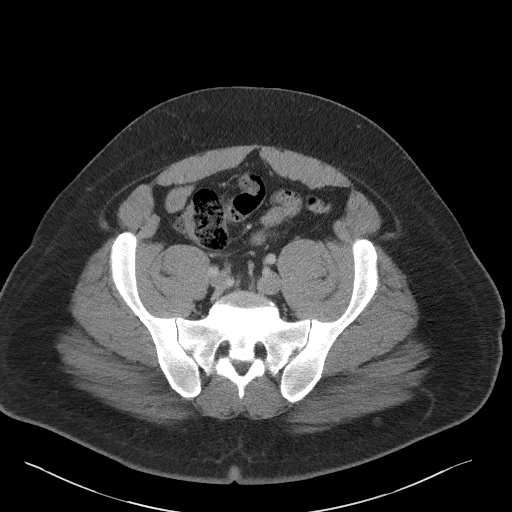
[im 39/102  soft-tissue]
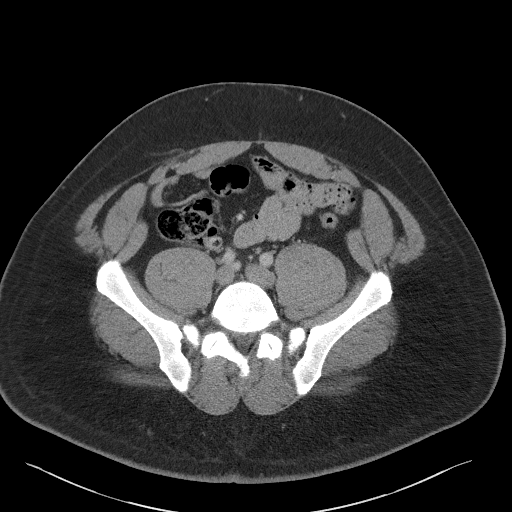
[im 49/102  soft-tissue]
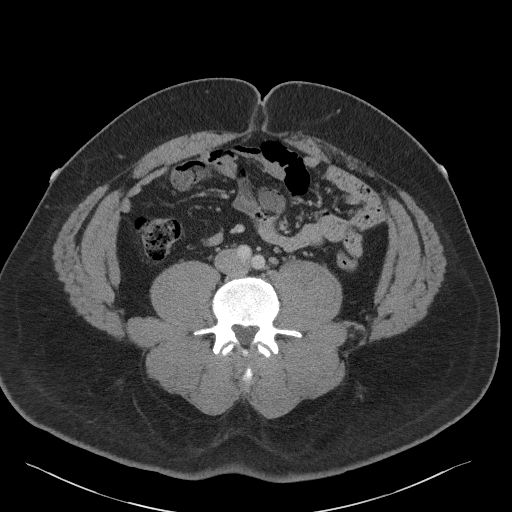
[im 53/102  soft-tissue]
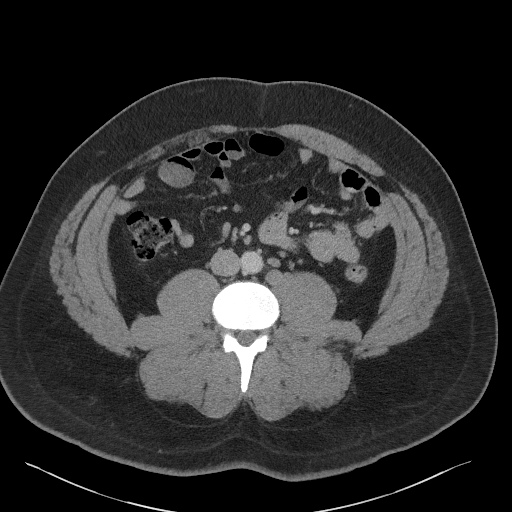
[im 63/102  soft-tissue]
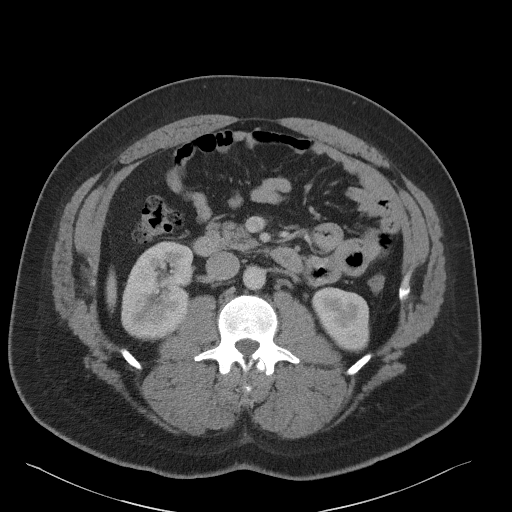
[im 63/102  bone]
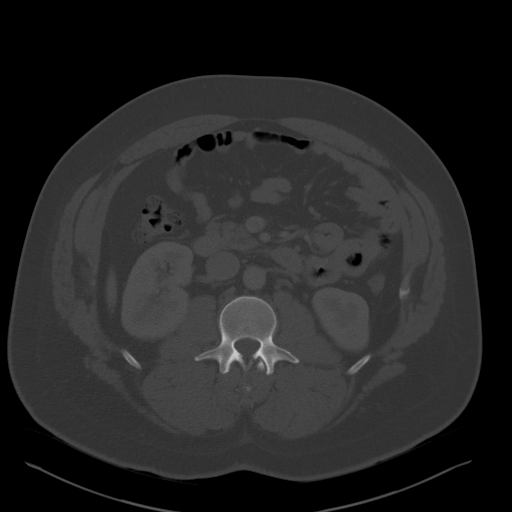
[im 68/102  soft-tissue]
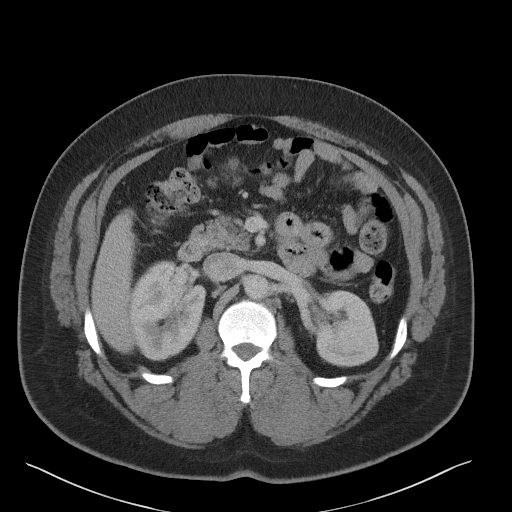
[im 77/102  soft-tissue]
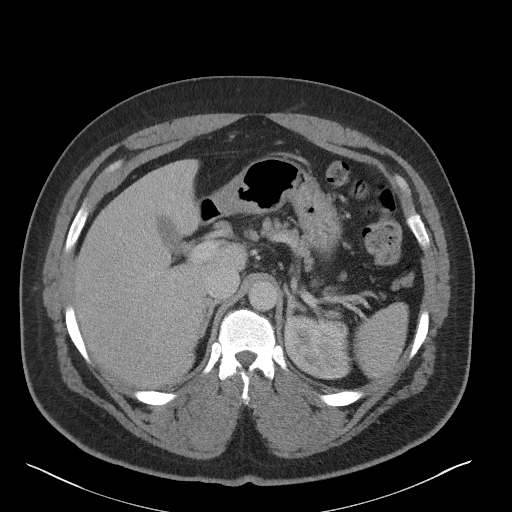
[im 82/102  soft-tissue]
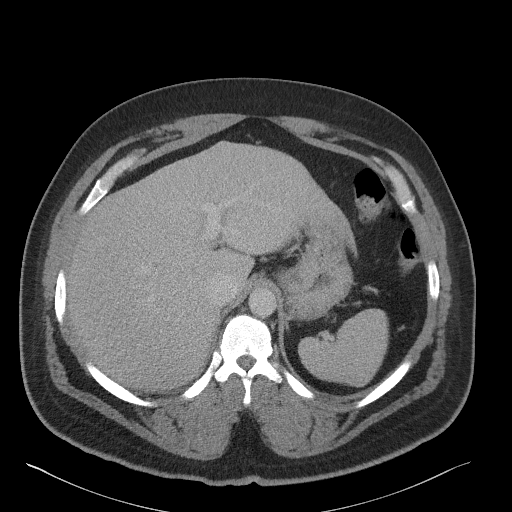
[im 87/102  soft-tissue]
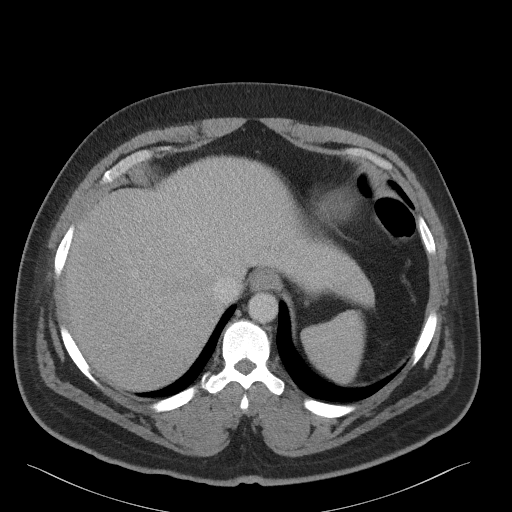
[im 97/102  soft-tissue]
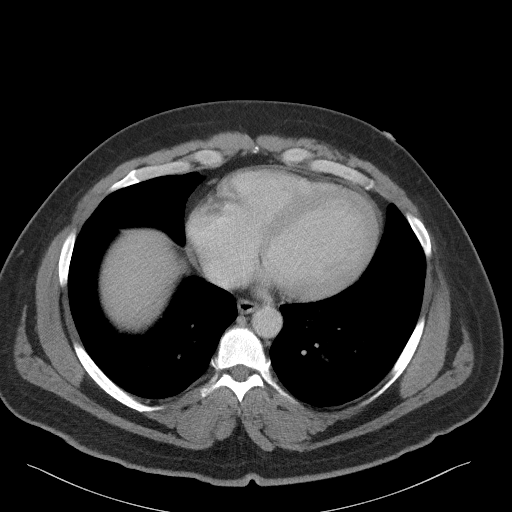

[Series 5: coronal st · coronal · 0.96mm/px · 3 of 108 slices shown]
[im 36/108  soft-tissue]
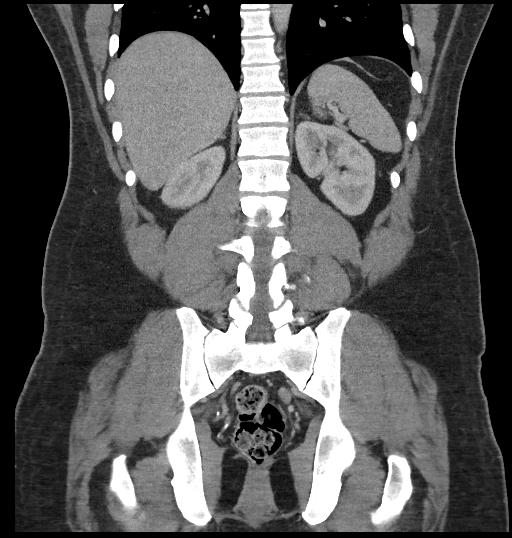
[im 48/108  soft-tissue]
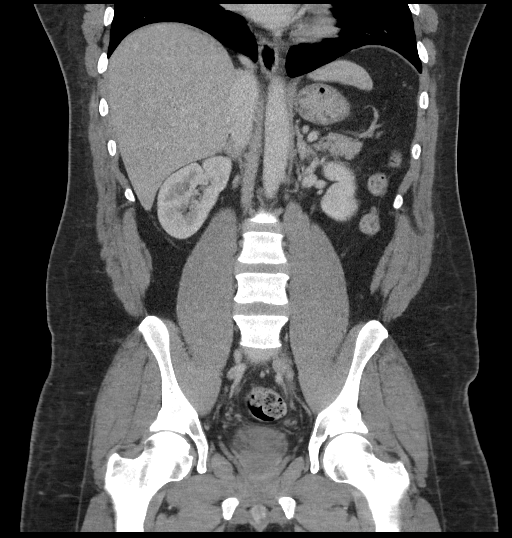
[im 60/108  soft-tissue]
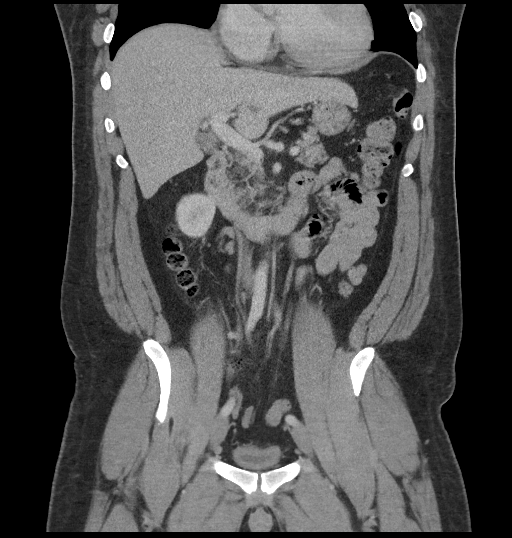

[17 of 46 positions shown; findings below may reference images not displayed]

FINDINGS: Lower chest: The lung bases are clear of acute process. No pleural
effusion or pulmonary lesions. The heart is normal in size. No
pericardial effusion. The distal esophagus and aorta are
unremarkable.

Hepatobiliary: No hepatic lesions or intrahepatic biliary
dilatation. The gallbladder is normal. No common bile duct
dilatation.

Pancreas: No mass, inflammation or ductal dilatation.

Spleen: Normal size.  No focal lesions.

Adrenals/Urinary Tract: Adrenal glands and kidneys are normal. The
bladder is unremarkable.

Stomach/Bowel: The stomach, duodenum, small bowel and colon are
unremarkable. No acute inflammatory changes, mass lesions or
obstructive findings. The terminal ileum is normal. The appendix is
normal.

Vascular/Lymphatic: The aorta is normal in caliber. No dissection.
The branch vessels are patent. The major venous structures are
patent. No mesenteric or retroperitoneal mass or adenopathy. Small
scattered lymph nodes are noted.

Reproductive: The prostate gland and seminal vesicles are
unremarkable.

Other: No pelvic mass or adenopathy. No free pelvic fluid
collections. No inguinal mass or adenopathy. No abdominal wall
hernia or subcutaneous lesions.

Musculoskeletal: No significant bony findings.
IMPRESSION: No acute abdominal/pelvic findings, mass lesions or adenopathy.

## 2024-03-29 ENCOUNTER — Ambulatory Visit: Admission: EM | Admit: 2024-03-29 | Discharge: 2024-03-29 | Disposition: A | Discharge status: Home / Self Care

## 2024-03-29 DIAGNOSIS — S46812A Strain of other muscles, fascia and tendons at shoulder and upper arm level, left arm, initial encounter: Secondary | ICD-10-CM | POA: Diagnosis not present

## 2024-03-29 DIAGNOSIS — Z760 Encounter for issue of repeat prescription: Secondary | ICD-10-CM

## 2024-03-29 MED ORDER — DEXAMETHASONE SOD PHOSPHATE PF 10 MG/ML IJ SOLN
10.0000 mg | Freq: Once | INTRAMUSCULAR | Status: AC
  Administered 2024-03-29: 10 mg via INTRAMUSCULAR

## 2024-03-29 MED ORDER — AMLODIPINE BESYLATE 10 MG PO TABS: 10.0000 mg | ORAL_TABLET | Freq: Every day | ORAL | 1 refills | Status: AC

## 2024-03-29 MED ORDER — ALLOPURINOL 300 MG PO TABS: 300.0000 mg | ORAL_TABLET | Freq: Every day | ORAL | 0 refills | Status: AC

## 2024-03-29 MED ORDER — LOSARTAN POTASSIUM 50 MG PO TABS: 50.0000 mg | ORAL_TABLET | Freq: Two times a day (BID) | ORAL | 0 refills | Status: AC

## 2024-03-29 MED ORDER — CYCLOBENZAPRINE HCL 10 MG PO TABS: 10.0000 mg | ORAL_TABLET | Freq: Two times a day (BID) | ORAL | 0 refills | Status: AC | PRN

## 2024-03-29 MED ORDER — PREDNISONE 20 MG PO TABS: 40.0000 mg | ORAL_TABLET | Freq: Every day | ORAL | 0 refills | Status: AC

## 2024-03-29 MED ORDER — KETOROLAC TROMETHAMINE 30 MG/ML IJ SOLN
30.0000 mg | Freq: Once | INTRAMUSCULAR | Status: AC
  Administered 2024-03-29: 30 mg via INTRAMUSCULAR

## 2024-03-29 NOTE — ED Triage Notes (Signed)
 Patient reports having a kink in his neck. The pain radiates down neck to back. This pain/kink has been constant since 03-19-2024. Reports sleeping is impossible and nothing is helping it. No sob. No chest pain. No other acute illness or concern.

## 2024-03-29 NOTE — Discharge Instructions (Signed)
 Symptoms and physical exam findings are most consistent with a strain of the left trapezius muscle.  Do not believe that imaging is necessary as there is no specific injury to the area and symptoms have been persistent for 3 weeks.  We will treat this with medication for pain, anti-inflammatories and muscle relaxers.  We will treat with the following: Toradol  injection given today. This is a medication to help with pain. This is not a narcotic.  Decadron  injection given today. This is a steroid to help with inflammation and pain. Start 03/30/24 Prednisone  40 mg (2 tablets) once daily for 4 days. Take this in the morning.  This is a steroid to help with inflammation and pain.  Flexeril  10 mg every 12 hours as needed for muscle spasms.  Use caution as this medication can cause drowsiness.  Light stretching to improve mobility. May alternate heat and ice to help with symptoms.   Refills called in for amlodipine , losartan  and allopurinol  Return to urgent care or PCP if symptoms worsen or fail to resolve.

## 2024-03-29 NOTE — ED Provider Notes (Signed)
 [facility]    CSN: [id number] Arrival date & time: 03/29/24  0805      History   Chief Complaint Chief Complaint  Patient presents with   Pain    HPI Marvin Mcguire is a 43 y.o. male.   43 year old male who presents urgent care with complaints of left neck pain extending into the left upper back.  This has been going on for almost 3 weeks now.  He reports that the pain is so severe that he cannot sleep at night.  He cannot look up due to the pain.  He has been trying over-the-counter medication without relief.  He did not have any injury to the area.  He woke up 1 day with the area hurting.  His blood pressure has been running high secondary to pain.  He does need refills on his blood pressure medications.  He denies any fevers, chest pain, cough, congestion, shortness of breath.  He has not had any injury to the area in the past.     Past Medical History:  Diagnosis Date   Eczema    Hypertension     Patient Active Problem List   Diagnosis Date Noted   OSA on CPAP 09/03/2019   Eczema 09/26/2016   Essential hypertension 09/26/2016    History reviewed. No pertinent surgical history.     Home Medications    Prior to Admission medications   Medication Sig Start Date End Date Taking? Authorizing Provider  colchicine 0.6 MG tablet Take 0.6 mg by mouth as directed. 12/10/23  Yes [provider]  cyclobenzaprine  (FLEXERIL ) 10 MG tablet Take 1 tablet (10 mg total) by mouth 2 (two) times daily as needed for muscle spasms. 03/29/24  Yes Jackelyne Sayer A, PA-C  HYDROcodone-acetaminophen  (NORCO) 7.5-325 MG tablet Take 1 tablet by mouth every 6 (six) hours as needed for moderate pain (pain score 4-6).   Yes [provider]  naproxen (NAPROSYN) 500 MG tablet Take 500 mg by mouth 2 (two) times daily with a meal. 08/23/23  Yes [provider]  predniSONE  (DELTASONE ) 20 MG tablet Take 2 tablets (40 mg total) by mouth daily with breakfast for 4  days. 03/29/24 04/02/24 Yes Taleigh Gero A, PA-C  UNKNOWN TO PATIENT Muscle Relaxer   Yes [provider]  acetaminophen  (PAIN RELIEF) 500 MG tablet Take 500 mg by mouth every 6 (six) hours as needed (For pain.).    [provider]  allopurinol  (ZYLOPRIM ) 300 MG tablet Take 1 tablet (300 mg total) by mouth daily. 03/29/24   Kloie Whiting A, PA-C  amLODipine  (NORVASC ) 10 MG tablet Take 1 tablet (10 mg total) by mouth daily. 03/29/24   Nalda Shackleford A, PA-C  hydrochlorothiazide (HYDRODIURIL) 25 MG tablet Take by mouth. 10/13/16   [provider]  losartan  (COZAAR ) 50 MG tablet Take 1 tablet (50 mg total) by mouth 2 (two) times daily. 03/29/24 04/28/24  Teresa Almarie LABOR, PA-C  Multiple Vitamins-Minerals (MULTIVITAMIN MEN) TABS Take 1 tablet by mouth daily.    [provider]  pantoprazole  (PROTONIX ) 20 MG tablet Take 1 tablet (20 mg total) by mouth daily. 04/23/21   Towana Ozell BROCKS, MD  triamcinolone  cream (KENALOG ) 0.1 % Apply topically.    [provider]    Family History History reviewed. No pertinent family history.  Social History Social History   Tobacco Use   Smoking status: Every Day    Current packs/day: 1.00    Types: Cigarettes   Smokeless tobacco:  Never  Vaping Use   Vaping status: Former   Substances: Nicotine, Flavoring  Substance Use Topics   Alcohol use: Not Currently    Comment: quit 04/02/2021   Drug use: Yes    Types: Marijuana     Allergies   Hydrocodone   Review of Systems Review of Systems  Constitutional:  Negative for chills and fever.  HENT:  Negative for ear pain and sore throat.   Eyes:  Negative for pain and visual disturbance.  Respiratory:  Negative for cough and shortness of breath.   Cardiovascular:  Negative for chest pain and palpitations.  Gastrointestinal:  Negative for abdominal pain and vomiting.  Genitourinary:  Negative for dysuria and hematuria.  Musculoskeletal:  Positive for  back pain (Left upper back) and neck pain (Left neck). Negative for arthralgias.  Skin:  Negative for color change and rash.  Neurological:  Negative for seizures and syncope.  All other systems reviewed and are negative.    Physical Exam Triage Vital Signs ED Triage Vitals  Encounter Vitals Group     BP 03/29/24 0843 (!) 182/119     Girls Systolic BP Percentile --      Girls Diastolic BP Percentile --      Boys Systolic BP Percentile --      Boys Diastolic BP Percentile --      Pulse Rate 03/29/24 0843 87     Resp 03/29/24 0843 20     Temp 03/29/24 0843 98.2 F (36.8 C)     Temp Source 03/29/24 0843 Temporal     SpO2 03/29/24 0843 97 %     Weight 03/29/24 0840 285 lb 0.9 oz (129.3 kg)     Height 03/29/24 0840 6' 2 (1.88 m)     Head Circumference --      Peak Flow --      Pain Score 03/29/24 0837 9     Pain Loc --      Pain Education --      Exclude from Growth Chart --    No data found.  Updated Vital Signs BP (!) 182/119 (BP Location: Left Arm)   Pulse 87   Temp 98.2 F (36.8 C) (Temporal)   Resp 20   Ht 6' 2 (1.88 m)   Wt 285 lb 0.9 oz (129.3 kg)   SpO2 97%   BMI 36.60 kg/m   Visual Acuity Right Eye Distance:   Left Eye Distance:   Bilateral Distance:    Right Eye Near:   Left Eye Near:    Bilateral Near:     Physical Exam Vitals and nursing note reviewed.  Constitutional:      General: He is not in acute distress.    Appearance: He is well-developed.  HENT:     Head: Normocephalic and atraumatic.  Eyes:     Conjunctiva/sclera: Conjunctivae normal.  Cardiovascular:     Rate and Rhythm: Normal rate and regular rhythm.     Heart sounds: No murmur heard. Pulmonary:     Effort: Pulmonary effort is normal. No respiratory distress.     Breath sounds: Normal breath sounds.  Abdominal:     Palpations: Abdomen is soft.     Tenderness: There is no abdominal tenderness.  Musculoskeletal:        General: No swelling.     Cervical back: Neck supple.        Back:  Skin:    General: Skin is warm and dry.     Capillary Refill:  Capillary refill takes less than 2 seconds.  Neurological:     Mental Status: He is alert.  Psychiatric:        Mood and Affect: Mood normal.      UC Treatments / Results  Labs (all labs ordered are listed, but only abnormal results are displayed) Labs Reviewed - No data to display  EKG   Radiology No results found.  Procedures Procedures (including critical care time)  Medications Ordered in UC Medications  ketorolac  (TORADOL ) 30 MG/ML injection 30 mg (has no administration in time range)  dexamethasone  (DECADRON ) injection 10 mg (has no administration in time range)    Initial Impression / Assessment and Plan / UC Course  I have reviewed the triage vital signs and the nursing notes.  Pertinent labs & imaging results that were available during my care of the patient were reviewed by me and considered in my medical decision making (see chart for details).     Strain of left trapezius muscle, initial encounter  Medication refill   EKG done today shows normal sinus rhythm.  Symptoms and physical exam findings are most consistent with a strain of the left trapezius muscle.  Do not believe that imaging is necessary as there is no specific injury to the area and symptoms have been persistent for 3 weeks.  We will treat this with medication for pain, anti-inflammatories and muscle relaxers.  We will treat with the following: Toradol  injection given today. This is a medication to help with pain. This is not a narcotic.  Decadron  injection given today. This is a steroid to help with inflammation and pain. Start 03/30/24 Prednisone  40 mg (2 tablets) once daily for 4 days. Take this in the morning.  This is a steroid to help with inflammation and pain.  Flexeril  10 mg every 12 hours as needed for muscle spasms.  Use caution as this medication can cause drowsiness.  Light stretching to improve mobility.  May alternate heat and ice to help with symptoms.   Refills called in for amlodipine , losartan  and allopurinol  Return to urgent care or PCP if symptoms worsen or fail to resolve.    Final Clinical Impressions(s) / UC Diagnoses   Final diagnoses:  Strain of left trapezius muscle, initial encounter  Medication refill     Discharge Instructions      Symptoms and physical exam findings are most consistent with a strain of the left trapezius muscle.  Do not believe that imaging is necessary as there is no specific injury to the area and symptoms have been persistent for 3 weeks.  We will treat this with medication for pain, anti-inflammatories and muscle relaxers.  We will treat with the following: Toradol  injection given today. This is a medication to help with pain. This is not a narcotic.  Decadron  injection given today. This is a steroid to help with inflammation and pain. Start 03/30/24 Prednisone  40 mg (2 tablets) once daily for 4 days. Take this in the morning.  This is a steroid to help with inflammation and pain.  Flexeril  10 mg every 12 hours as needed for muscle spasms.  Use caution as this medication can cause drowsiness.  Light stretching to improve mobility. May alternate heat and ice to help with symptoms.   Refills called in for amlodipine , losartan  and allopurinol  Return to urgent care or PCP if symptoms worsen or fail to resolve.      ED Prescriptions     Medication Sig Dispense Auth. Provider  cyclobenzaprine  (FLEXERIL ) 10 MG tablet Take 1 tablet (10 mg total) by mouth 2 (two) times daily as needed for muscle spasms. 20 tablet Manfred Laspina A, PA-C   predniSONE  (DELTASONE ) 20 MG tablet Take 2 tablets (40 mg total) by mouth daily with breakfast for 4 days. 8 tablet Teresa Almarie LABOR, PA-C   losartan  (COZAAR ) 50 MG tablet Take 1 tablet (50 mg total) by mouth 2 (two) times daily. 60 tablet Rakeem Colley A, PA-C   amLODipine  (NORVASC ) 10 MG tablet Take 1 tablet (10  mg total) by mouth daily. 30 tablet Temitope Flammer A, PA-C   allopurinol  (ZYLOPRIM ) 300 MG tablet Take 1 tablet (300 mg total) by mouth daily. 30 tablet Teresa Almarie LABOR, [place]      PDMP not reviewed this encounter.   Teresa Almarie LABOR, [place] 03/29/24 [phone]
